# Patient Record
Sex: Female | Born: 1965 | Race: White | Hispanic: No | Marital: Married | Smoking: Never smoker
Health system: Southern US, Community
[De-identification: ages and names within clinical notes are randomized; demographics above are authoritative.]

## PROBLEM LIST (undated history)

## (undated) DIAGNOSIS — K602 Anal fissure, unspecified: Secondary | ICD-10-CM

## (undated) HISTORY — PX: AUGMENTATION MAMMAPLASTY: SUR837

## (undated) HISTORY — DX: Anal fissure, unspecified: K60.2

---

## 1999-07-31 HISTORY — PX: BREAST ENHANCEMENT SURGERY: SHX7

## 2001-03-19 ENCOUNTER — Other Ambulatory Visit: Admission: RE | Admit: 2001-03-19 | Discharge: 2001-03-19 | Payer: Self-pay | Admitting: Obstetrics and Gynecology

## 2002-03-26 ENCOUNTER — Other Ambulatory Visit: Admission: RE | Admit: 2002-03-26 | Discharge: 2002-03-26 | Payer: Self-pay | Admitting: Obstetrics and Gynecology

## 2003-04-16 ENCOUNTER — Other Ambulatory Visit: Admission: RE | Admit: 2003-04-16 | Discharge: 2003-04-16 | Payer: Self-pay | Admitting: Obstetrics and Gynecology

## 2003-07-31 HISTORY — PX: CHOLECYSTECTOMY: SHX55

## 2004-04-19 ENCOUNTER — Other Ambulatory Visit: Admission: RE | Admit: 2004-04-19 | Discharge: 2004-04-19 | Payer: Self-pay | Admitting: Obstetrics and Gynecology

## 2004-04-27 ENCOUNTER — Encounter: Admission: RE | Admit: 2004-04-27 | Discharge: 2004-04-27 | Payer: Self-pay | Admitting: Obstetrics and Gynecology

## 2004-08-31 ENCOUNTER — Ambulatory Visit: Payer: Self-pay | Admitting: Internal Medicine

## 2004-09-28 ENCOUNTER — Ambulatory Visit (HOSPITAL_COMMUNITY): Admission: RE | Admit: 2004-09-28 | Discharge: 2004-09-28 | Payer: Self-pay | Admitting: Family Medicine

## 2004-10-25 ENCOUNTER — Encounter: Admission: RE | Admit: 2004-10-25 | Discharge: 2004-10-25 | Payer: Self-pay | Admitting: Gastroenterology

## 2004-11-02 ENCOUNTER — Ambulatory Visit (HOSPITAL_COMMUNITY): Admission: RE | Admit: 2004-11-02 | Discharge: 2004-11-02 | Payer: Self-pay | Admitting: Gastroenterology

## 2004-12-08 ENCOUNTER — Encounter: Admission: RE | Admit: 2004-12-08 | Discharge: 2004-12-08 | Payer: Self-pay | Admitting: Family Medicine

## 2004-12-20 ENCOUNTER — Ambulatory Visit (HOSPITAL_COMMUNITY): Admission: RE | Admit: 2004-12-20 | Discharge: 2004-12-20 | Payer: Self-pay | Admitting: *Deleted

## 2004-12-20 ENCOUNTER — Encounter (INDEPENDENT_AMBULATORY_CARE_PROVIDER_SITE_OTHER): Payer: Self-pay | Admitting: Specialist

## 2005-03-15 ENCOUNTER — Encounter: Admission: RE | Admit: 2005-03-15 | Discharge: 2005-03-15 | Payer: Self-pay | Admitting: Family Medicine

## 2005-07-30 HISTORY — PX: NASAL SEPTUM SURGERY: SHX37

## 2005-09-20 ENCOUNTER — Other Ambulatory Visit: Admission: RE | Admit: 2005-09-20 | Discharge: 2005-09-20 | Payer: Self-pay | Admitting: Family Medicine

## 2006-10-17 ENCOUNTER — Other Ambulatory Visit: Admission: RE | Admit: 2006-10-17 | Discharge: 2006-10-17 | Payer: Self-pay | Admitting: Family Medicine

## 2007-04-23 ENCOUNTER — Encounter: Admission: RE | Admit: 2007-04-23 | Discharge: 2007-04-23 | Payer: Self-pay | Admitting: Family Medicine

## 2007-10-23 ENCOUNTER — Other Ambulatory Visit: Admission: RE | Admit: 2007-10-23 | Discharge: 2007-10-23 | Payer: Self-pay | Admitting: Family Medicine

## 2008-05-21 ENCOUNTER — Encounter: Admission: RE | Admit: 2008-05-21 | Discharge: 2008-05-21 | Payer: Self-pay | Admitting: Family Medicine

## 2008-11-12 ENCOUNTER — Other Ambulatory Visit: Admission: RE | Admit: 2008-11-12 | Discharge: 2008-11-12 | Payer: Self-pay | Admitting: Family Medicine

## 2009-11-03 ENCOUNTER — Encounter: Admission: RE | Admit: 2009-11-03 | Discharge: 2009-11-03 | Payer: Self-pay | Admitting: Family Medicine

## 2009-11-17 ENCOUNTER — Other Ambulatory Visit: Admission: RE | Admit: 2009-11-17 | Discharge: 2009-11-17 | Payer: Self-pay | Admitting: Family Medicine

## 2010-11-27 ENCOUNTER — Other Ambulatory Visit: Payer: Self-pay | Admitting: Family Medicine

## 2010-11-27 DIAGNOSIS — Z1231 Encounter for screening mammogram for malignant neoplasm of breast: Secondary | ICD-10-CM

## 2010-12-15 NOTE — Op Note (Signed)
NAMECHAD, Laurie Meyer                 ACCOUNT NO.:  [id number]   MEDICAL RECORD NO.:  [id number]          PATIENT TYPE:  AMB   LOCATION:  DAY                          FACILITY:  [facility]   PHYSICIAN:  Vikki Ports, MDDATE OF BIRTH:  1965-10-02   DATE OF PROCEDURE:  12/20/2004  DATE OF DISCHARGE:                                 OPERATIVE REPORT   PREOPERATIVE DIAGNOSIS:  Symptomatic cholelithiasis.   POSTOPERATIVE DIAGNOSIS:  Symptomatic cholelithiasis.   PROCEDURE:  Laparoscopic cholecystectomy.   SURGEON:  Vikki Ports, M.D.   ASSISTANT:  Anselm Pancoast. Zachery Dakins, M.D.   ANESTHESIA:  General.   DESCRIPTION OF PROCEDURE:  The patient was taken to the operating room and  placed in the supine position.  After adequate general anesthesia was  induced using endotracheal tube, the abdomen was prepped and draped in the  normal sterile fashion.  Entering a transverse infraumbilical incision, I  dissected down to the fascia.  The fascia was opened vertically.  An 0  Vicryl pursestring suture was placed around the fascia defect and Hassan  trocar was placed in the abdomen.  The abdomen was insufflated with  continuous flow carbon dioxide and under direct visualization a 10 mm port  was placed in the subxiphoid region and two 5 mm ports were placed in the  right abdomen.  The gallbladder was identified and retracted cephalad.  The  neck of the gallbladder was easily identified.  A long, thin cystic duct was  identified.  A good window was dissected posterior to it.  It was triply  clipped and divided.  The cystic artery was dissected free, triply clipped  and divided in a similar fashion.  The gallbladder had good mesentery and  was easily taken off of the gallbladder bed using Bovie electrocautery and  placed in an EndoCatch bag.  Additional stones were removed from the inside  of the gallbladder to remove the gallbladder completely from the umbilical  port.  The fascial defect  was closed with a Vicryl suture.  The wound was  irrigated.  Pneumoperitoneum was released.  Trocars were removed.  The skin  incisions were closed with subcuticular 4-0 Monocryl and injected with 0.5  Marcaine.  The patient tolerated the procedure well and went to the PACU in  good condition.      KRH/MEDQ  D:  12/20/2004  T:  12/20/2004  Job:  [id number]

## 2010-12-21 ENCOUNTER — Ambulatory Visit
Admission: RE | Admit: 2010-12-21 | Discharge: 2010-12-21 | Disposition: A | Discharge status: Home / Self Care | Payer: BC Managed Care – PPO | Source: Ambulatory Visit | Attending: Family Medicine | Admitting: Family Medicine

## 2010-12-21 DIAGNOSIS — Z1231 Encounter for screening mammogram for malignant neoplasm of breast: Secondary | ICD-10-CM

## 2011-09-20 ENCOUNTER — Other Ambulatory Visit (HOSPITAL_COMMUNITY)
Admission: RE | Admit: 2011-09-20 | Discharge: 2011-09-20 | Disposition: A | Discharge status: Home / Self Care | Payer: BC Managed Care – PPO | Source: Ambulatory Visit | Attending: Family Medicine | Admitting: Family Medicine

## 2011-09-20 ENCOUNTER — Other Ambulatory Visit: Payer: Self-pay | Admitting: Family Medicine

## 2011-09-20 DIAGNOSIS — Z113 Encounter for screening for infections with a predominantly sexual mode of transmission: Secondary | ICD-10-CM | POA: Insufficient documentation

## 2011-09-20 DIAGNOSIS — Z124 Encounter for screening for malignant neoplasm of cervix: Secondary | ICD-10-CM | POA: Insufficient documentation

## 2011-10-12 ENCOUNTER — Ambulatory Visit (INDEPENDENT_AMBULATORY_CARE_PROVIDER_SITE_OTHER): Payer: BC Managed Care – PPO | Admitting: General Surgery

## 2011-10-12 ENCOUNTER — Encounter (INDEPENDENT_AMBULATORY_CARE_PROVIDER_SITE_OTHER): Payer: Self-pay | Admitting: General Surgery

## 2011-10-12 VITALS — BP 108/74 | HR 76 | Temp 97.6°F | Resp 12 | Ht 69.0 in | Wt 144.2 lb

## 2011-10-12 DIAGNOSIS — K602 Anal fissure, unspecified: Secondary | ICD-10-CM

## 2011-10-12 DIAGNOSIS — K625 Hemorrhage of anus and rectum: Secondary | ICD-10-CM | POA: Insufficient documentation

## 2011-10-12 NOTE — Patient Instructions (Signed)
Anal Fissure, Adult An anal fissure is a small tear or crack in the skin around the anus. Bleeding from a fissure usually stops on its own within a few minutes. However, bleeding will often reoccur with each bowel movement until the crack heals.  CAUSES   Passing large, hard stools.   Frequent diarrheal stools.   Constipation.   Inflammatory bowel disease (Crohn's disease or ulcerative colitis).   Infections.   Anal sex.  SYMPTOMS   Small amounts of blood seen on your stools, on toilet paper, or in the toilet after a bowel movement.   Rectal bleeding.   Painful bowel movements.   Itching or irritation around the anus.  DIAGNOSIS Your caregiver will examine the anal area. An anal fissure can usually be seen with careful inspection. A rectal exam may be performed and a short tube (anoscope) may be used to examine the anal canal. TREATMENT   You may be instructed to take fiber supplements. These supplements can soften your stool to help make bowel movements easier.   Sitz baths may be recommended to help heal the tear. Do not use soap in the sitz baths.   A medicated cream or ointment may be prescribed to lessen discomfort.  HOME CARE INSTRUCTIONS   Maintain a diet high in fruits, whole grains, and vegetables. Avoid constipating foods like bananas and dairy products.   Take sitz baths as directed by your caregiver.   Drink enough fluids to keep your urine clear or pale yellow.   Only take over-the-counter or prescription medicines for pain, discomfort, or fever as directed by your caregiver. Do not take aspirin as this may increase bleeding.   Do not use ointments containing numbing medications (anesthetics) or hydrocortisone. They could slow healing.  SEEK MEDICAL CARE IF:   Your fissure is not completely healed within 3 days.   You have further bleeding.   You have a fever.   You have diarrhea mixed with blood.   You have pain.   Your problem is getting worse  rather than better.  MAKE SURE YOU:   Understand these instructions.   Will watch your condition.   Will get help right away if you are not doing well or get worse.  Document Released: 07/16/2005 Document Revised: 07/05/2011 Document Reviewed: 12/31/2010 [facility] Patient Information 2012 [place], [place].  Fiber Content in Foods Drinking plenty of fluids and consuming foods high in fiber can help with constipation. See the list below for the fiber content of some common foods. Starches and Grains / Dietary Fiber (g)  Cheerios, 1 cup / 3 g   Kellogg's Corn Flakes, 1 cup / 0.7 g   Rice Krispies, 1  cup / 0.3 g   Quaker Oat Life Cereal,  cup / 2.1 g   Oatmeal, instant (cooked),  cup / 2 g   Kellogg's Frosted Mini Wheats, 1 cup / 5.1 g   Rice, brown, long-grain (cooked), 1 cup / 3.5 g   Rice, white, long-grain (cooked), 1 cup / 0.6 g   Macaroni, cooked, enriched, 1 cup / 2.5 g  Legumes / Dietary Fiber (g)  Beans, baked, canned, plain or vegetarian,  cup / 5.2 g   Beans, kidney, canned,  cup / 6.8 g   Beans, pinto, dried (cooked),  cup / 7.7 g   Beans, pinto, canned,  cup / 5.5 g  Breads and Crackers / Dietary Fiber (g)  Graham crackers, plain or honey, 2 squares / 0.7 g   Saltine  crackers, 3 squares / 0.3 g   Pretzels, plain, salted, 10 pieces / 1.8 g   Bread, whole-wheat, 1 slice / 1.9 g   Bread, white, 1 slice / 0.7 g   Bread, raisin, 1 slice / 1.2 g   Bagel, plain, 3 oz / 2 g   Tortilla, flour, 1 oz / 0.9 g   Tortilla, corn, 1 small / 1.5 g   Bun, hamburger or hotdog, 1 small / 0.9 g  Fruits / Dietary Fiber (g)  Apple, raw with skin, 1 medium / 4.4 g   Applesauce, sweetened,  cup / 1.5 g   Banana,  medium / 1.5 g   Grapes, 10 grapes / 0.4 g   Orange, 1 small / 2.3 g   Raisin, 1.5 oz / 1.6 g   Melon, 1 cup / 1.4 g  Vegetables / Dietary Fiber (g)  Green beans, canned,  cup / 1.3 g   Carrots (cooked),  cup / 2.3 g   Broccoli  (cooked),  cup / 2.8 g   Peas, frozen (cooked),  cup / 4.4 g   Potatoes, mashed,  cup / 1.6 g   Lettuce, 1 cup / 0.5 g   Corn, canned,  cup / 1.6 g   Tomato,  cup / 1.1 g  Document Released: 12/02/2006 Document Revised: 07/05/2011 Document Reviewed: 01/27/2007 [facility] Patient Information 2012 [place], [place].

## 2011-10-13 NOTE — Progress Notes (Signed)
Patient ID: Laurie Meyer, female   DOB: 1966/07/06, 46 y.o.   MRN: [id number]  Chief Complaint  Patient presents with  . Rectal Pain    new pt- eval rectal pain    HPI Laurie Meyer is a 46 y.o. female.   HPI 46 year old Caucasian female referred by Dr. Matthias Hughs for evaluation of an anal fissure and anal pain. The patient states that she's had a prolonged history of problems with her bowel habits ever since she had a cholecystectomy done. She has been seeing Dr. Matthias Hughs for over a year. He was initially managing her for slow transit constipation. She has undergone biofeedback training. She states that she started to have a new problem in November. It was mainly anal pain with defecation. Initially the pain would last hours. Now the pain just last during defecation. She states the pain is located right at the anal opening and describes it as a dull pain. It does radiate to a degree. She reports averaging about 5 bowel movements a week. She is taking MiraLax on a daily basis as well as Librarian, academic. She reports alternating bouts of loose stool and constipation. The anal pain mainly occurs when she has a bulky stool. She has noticed some blood in the commode on a very rare occasion. She states that she sits on the commode for less than 5 minutes time. She does strain. She drinks approximately 2 bottles of water per day. She reports eating a high-fiber diet. She initially was given a prescription for a diltiazem ointment and was told to do sitz baths as well. She only did the diltiazem ointment for approximately 10 days. She states while using the diltiazem ointment she got improvement. She rarely has abdominal pain. She sometimes feels bloated. She denies any weight loss or fecal/urinary incontinence.  History reviewed. No pertinent past medical history.  Past Surgical History  Procedure Date  . Breast enhancement surgery 2001  . Nasal septum surgery 2007  . Cholecystectomy 2005    Family History  Problem  Relation Age of Onset  . Cancer Maternal Uncle     lung    Social History History  Substance Use Topics  . Smoking status: Never Smoker   . Smokeless tobacco: Not on file  . Alcohol Use: No    No Known Allergies  Current Outpatient Prescriptions  Medication Sig Dispense Refill  . AVIANE 0.1-20 MG-MCG tablet daily.      Marland Kitchen Fexofenadine HCl (ALLEGRA PO) Take by mouth daily.      . Multiple Vitamin (MULTIVITAMIN) capsule Take 1 capsule by mouth daily.      . polyethylene glycol powder (GLYCOLAX/MIRALAX) powder daily.      . Probiotic Product (ALIGN PO) Take by mouth daily.        Review of Systems Review of Systems  Constitutional: Negative for fever, activity change, appetite change and unexpected weight change.  HENT: Negative for hearing loss, congestion and neck pain.   Eyes: Negative for photophobia and visual disturbance.  Respiratory: Negative for chest tightness and shortness of breath.   Cardiovascular: Negative for chest pain.  Gastrointestinal: Negative for abdominal distention.       See hpi  Genitourinary: Negative for dysuria, hematuria and difficulty urinating.  Musculoskeletal: Negative for back pain and gait problem.  Skin: Negative for color change and wound.  Neurological: Negative for tremors, seizures, speech difficulty, weakness and numbness.  Hematological: Negative for adenopathy. Does not bruise/bleed easily.  Psychiatric/Behavioral: Negative for behavioral problems and sleep  disturbance.    Blood pressure 108/74, pulse 76, temperature 97.6 F (36.4 C), temperature source Temporal, resp. rate 12, height 5\' 9"  (1.753 m), weight 144 lb 3.2 oz (65.409 kg).  Physical Exam Physical Exam  Vitals reviewed. Constitutional: She is oriented to person, place, and time. She appears well-developed and well-nourished. No distress.       thin  HENT:  Head: Normocephalic and atraumatic.  Right Ear: External ear normal.  Left Ear: External ear normal.  Nose:  Nose normal.  Eyes: Conjunctivae are normal. No scleral icterus.  Neck: Normal range of motion. Neck supple. No tracheal deviation present.  Cardiovascular: Normal rate, regular rhythm and normal heart sounds.   Pulmonary/Chest: Effort normal and breath sounds normal. No respiratory distress. She has no wheezes.  Abdominal: Soft. Bowel sounds are normal. She exhibits no distension. There is no tenderness.  Genitourinary: Rectal exam shows no external hemorrhoid and no mass.       Small fissure in posterior midline. DRE -increased anal tone. Tender with DRE. No anoscopy attempted secondary to presence of fissure  Musculoskeletal: Normal range of motion. She exhibits no edema and no tenderness.  Lymphadenopathy:    She has no cervical adenopathy.  Neurological: She is alert and oriented to person, place, and time.  Skin: Skin is warm and dry. She is not diaphoretic.  Psychiatric: She has a normal mood and affect. Her behavior is normal. Judgment and thought content normal.    Data Reviewed Dr Buccini's notes from 11/12, 04/12/11, 10/27/10  Assessment    Anal pain Anal fissure    Plan    We discussed the etiology of anal fissures. The patient was given educational material as well as diagrams. We discussed nonoperative and operative management of anal fissures.  We discussed nonoperative management including correcting underlying bowel habits such as constipation, avoiding bathroom reading, avoiding straining with defecation. We also discussed the use of topical ointments such as nifedipine or diltiazem ointment. We also discussed the use of Botox injection.  With respect to surgical intervention, we discussed an anal sphincterotomy. I described how the procedure is performed. We also discussed the aftercare. We discussed the risk and benefits of surgery including but not limited to bleeding, infection, blood clot formation, general anesthesia risk, urinary retention, and the risk of  incontinence. We discussed a 20-25% chance of incontinence to flatus, a 10-20% chance of incontinence to liquid stool, and a 5-10% chance of incontinence to solid stool. I explained that the percentages I quoted are from the literature and not from my personal practice experience.  My recommendation was to continue with non-operative management first since she did get some improvement with Diltiazem ointment.   The patient has elected to continue with non-operative management for now. She was instructed to use the diltiazem ointment twice a day for a minimum of 6 weeks, increase the water in her diet, and avoid straining. With her underlying slow transit constipation, this isn't going to be an easy fix  F/u 6-8 weeks  [name]. Andrey Campanile, MD, FACS General, Bariatric, & Minimally Invasive Surgery [facility] [place] [facility], [place]         [facility] M 10/13/2011, 12:55 PM

## 2011-11-21 ENCOUNTER — Encounter (INDEPENDENT_AMBULATORY_CARE_PROVIDER_SITE_OTHER): Payer: Self-pay | Admitting: General Surgery

## 2011-11-21 ENCOUNTER — Ambulatory Visit (INDEPENDENT_AMBULATORY_CARE_PROVIDER_SITE_OTHER): Payer: BC Managed Care – PPO | Admitting: General Surgery

## 2011-11-21 VITALS — BP 98/60 | HR 80 | Resp 16 | Ht 68.0 in | Wt 142.0 lb

## 2011-11-21 DIAGNOSIS — K602 Anal fissure, unspecified: Secondary | ICD-10-CM

## 2011-11-21 MED ORDER — POLYETHYLENE GLYCOL 3350 17 GM/SCOOP PO POWD: 17.0000 g | Freq: Every day | ORAL | Status: DC

## 2011-11-21 NOTE — Progress Notes (Signed)
Subjective:     Patient ID: Laurie Meyer, female   DOB: Dec 21, 1965, 46 y.o.   MRN: [id number]  HPI 46 year old Caucasian female comes in for followup for her anal fissure and slow transit constipation. I initially saw her on March 15. We diagnosed an anal fissure at that time. She was instructed to do diltiazem twice a day, increase her daily water intake, and increase the fiber in her diet. She states that she has done very well. She did have an episode of gastritis a few weeks ago that resulted in vomiting, diarrhea, and indigestion. During that week she did not use the diltiazem ointment. However her symptoms have resolved. She denies any pain with defecation. She denies any discomfort when having a bowel movement anymore. She denies any blood or to a paper on the commode. She is still taking MiraLax on a daily basis. The frequency of her bowel movements have increased. There are several days where she may get a day or 2 without a bowel movement; however, she feels she is more regular than she used to be. She attributes this to increasing the fiber in her diet. She states that she is eating more spinach and more beans. However she really hasn't increase her water intake.  Review of Systems See above    Objective:   Physical Exam BP 98/60  Pulse 80  Resp 16  Ht 5\' 8"  (1.727 m)  Wt 142 lb (64.411 kg)  BMI 21.59 kg/m2 Alert, nad Skin - no rash, edema, jaundice Psych - alert, appropriate Rectal- no ext hemorrhoid, no prolapsed hemorrhoid. Healed posterior anal fissure    Assessment:     Anal fissure-resolved Slow transit constipation    Plan:     I'm glad her symptoms have dramatically improved. It appears her anal fissure is healed. However I cautioned her that she is at risk for recurrence of her fissure if she should become severely constipated. I instructed her to continue her high fiber diet as well as to increase her daily water intake. Hopefully over time she can come off the  MiraLax. We renewed her MiraLax prescription. Followup p.r.n.  [name]. Andrey Campanile, MD, FACS General, Bariatric, & Minimally Invasive Surgery [facility], [place]

## 2011-11-21 NOTE — Patient Instructions (Signed)
Keep eating a high fiber diet and increase your water intake

## 2012-04-24 ENCOUNTER — Other Ambulatory Visit: Payer: Self-pay | Admitting: Family Medicine

## 2012-04-24 ENCOUNTER — Other Ambulatory Visit (HOSPITAL_COMMUNITY)
Admission: RE | Admit: 2012-04-24 | Discharge: 2012-04-24 | Disposition: A | Discharge status: Home / Self Care | Payer: BC Managed Care – PPO | Source: Ambulatory Visit | Attending: Family Medicine | Admitting: Family Medicine

## 2012-04-24 DIAGNOSIS — Z1151 Encounter for screening for human papillomavirus (HPV): Secondary | ICD-10-CM | POA: Insufficient documentation

## 2012-04-24 DIAGNOSIS — Z124 Encounter for screening for malignant neoplasm of cervix: Secondary | ICD-10-CM | POA: Insufficient documentation

## 2012-05-06 ENCOUNTER — Ambulatory Visit: Payer: BC Managed Care – PPO | Attending: Family Medicine

## 2012-05-06 DIAGNOSIS — M545 Low back pain, unspecified: Secondary | ICD-10-CM | POA: Insufficient documentation

## 2012-05-06 DIAGNOSIS — M25559 Pain in unspecified hip: Secondary | ICD-10-CM | POA: Insufficient documentation

## 2012-05-06 DIAGNOSIS — IMO0001 Reserved for inherently not codable concepts without codable children: Secondary | ICD-10-CM | POA: Insufficient documentation

## 2012-05-13 ENCOUNTER — Ambulatory Visit: Payer: BC Managed Care – PPO

## 2012-05-15 ENCOUNTER — Ambulatory Visit: Payer: BC Managed Care – PPO

## 2012-05-19 ENCOUNTER — Ambulatory Visit: Payer: BC Managed Care – PPO

## 2012-05-22 ENCOUNTER — Ambulatory Visit: Payer: BC Managed Care – PPO

## 2012-05-26 ENCOUNTER — Ambulatory Visit: Payer: BC Managed Care – PPO

## 2012-05-28 ENCOUNTER — Ambulatory Visit: Payer: BC Managed Care – PPO | Admitting: Physical Therapy

## 2012-06-04 ENCOUNTER — Ambulatory Visit: Payer: BC Managed Care – PPO | Admitting: Physical Therapy

## 2012-06-09 ENCOUNTER — Other Ambulatory Visit (INDEPENDENT_AMBULATORY_CARE_PROVIDER_SITE_OTHER): Payer: Self-pay | Admitting: General Surgery

## 2012-09-24 ENCOUNTER — Other Ambulatory Visit (INDEPENDENT_AMBULATORY_CARE_PROVIDER_SITE_OTHER): Payer: Self-pay | Admitting: General Surgery

## 2012-10-01 ENCOUNTER — Other Ambulatory Visit: Payer: Self-pay

## 2012-10-01 DIAGNOSIS — Z1231 Encounter for screening mammogram for malignant neoplasm of breast: Secondary | ICD-10-CM

## 2012-11-13 ENCOUNTER — Ambulatory Visit
Admission: RE | Admit: 2012-11-13 | Discharge: 2012-11-13 | Disposition: A | Discharge status: Home / Self Care | Payer: 59 | Source: Ambulatory Visit

## 2012-11-13 DIAGNOSIS — Z1231 Encounter for screening mammogram for malignant neoplasm of breast: Secondary | ICD-10-CM

## 2012-12-25 ENCOUNTER — Other Ambulatory Visit (INDEPENDENT_AMBULATORY_CARE_PROVIDER_SITE_OTHER): Payer: Self-pay | Admitting: General Surgery

## 2013-02-06 ENCOUNTER — Ambulatory Visit (INDEPENDENT_AMBULATORY_CARE_PROVIDER_SITE_OTHER): Payer: 59 | Admitting: General Surgery

## 2013-02-06 ENCOUNTER — Encounter (INDEPENDENT_AMBULATORY_CARE_PROVIDER_SITE_OTHER): Payer: Self-pay | Admitting: General Surgery

## 2013-02-06 VITALS — BP 100/70 | HR 78 | Temp 97.5°F | Resp 18 | Ht 68.5 in | Wt 148.2 lb

## 2013-02-06 DIAGNOSIS — K602 Anal fissure, unspecified: Secondary | ICD-10-CM

## 2013-02-06 NOTE — Patient Instructions (Signed)
WHAT IS AN ANAL FISSURE? An anal fissure (fissure-in-ano) is a small, oval shaped tear in skin that lines the opening of the anus. Fissures typically cause severe pain and bleeding with bowel movements. Fissures are quite common in the general population, but are often confused with other causes of pain and bleeding, such as hemorrhoids. WHAT ARE THE SYMPTOMS OF AN ANAL FISSURE? The typical symptoms of an anal fissure include severe pain during, and especially after, a bowel movement, lasting from several minutes to a few hours. Patients may also notice bright red blood from the anus that can be seen on the toilet paper or on the stool. Between bowel movements, patients with anal fissures are often relatively symptom-free. Many patients are fearful of having a bowel movement and may try to avoid defecation secondary to the pain.  WHAT CAUSES AN ANAL FISSURE? Fissures are usually caused by trauma to the inner lining of the anus. Patients with tight anal sphincter muscles (i.e., increased muscle tone) are more prone to developing anal fissures. A hard, dry bowel movement is typically responsible, but loose stools and diarrhea can also be the cause. Following a bowel movement, severe anal pain can produce spasm of the anal sphincter muscle, resulting in a decrease in blood flow to the site of the injury, thus impairing healing of the wound. The next bowel movement results in more pain, anal spasm, decreased blood flow to the area, and the cycle continues. Treatments are aimed at interrupting this cycle by relaxing the anal sphincter muscle to promote healing of the fissure.  Other, less common, causes include inflammatory conditions and certain anal infections or tumors. Anal fissures may be acute (recent onset) or chronic (present for a long period of time). Chronic fissures may be more difficult to treat, and may also have an external lump associated with the tear, called a sentinel pile or skin tag, as well as  extra tissue just inside the anal canal (hypertrophied papilla) . WHAT IS THE TREATMENT OF ANAL FISSURES? The majority of anal fissures do not require surgery. The most common treatment for an acute anal fissure consists of making the stool more formed and bulky with a diet high in fiber and utilization of over-the-counter fiber supplementation (totaling 25-35 grams of fiber/day). Stool softeners and increasing water intake may be necessary to promote soft bowel movements and aid in the healing process. Topical anesthetics for pain and warm tub baths (sitz baths) for 10-20 minutes several times a day (especially after bowel movements) are soothing and promote relaxation of the anal muscles, which may help the healing process.  Other medications (such as nitroglycerin, nifedipine, or diltiazem) may be prescribed that allow relaxation of the anal sphincter muscles. Your surgeon will go over benefits and side-effects of each of these with you. Narcotic pain medications are not recommended for anal fissures, as they promote constipation. Chronic fissures are generally more difficult to treat, and your surgeon may advise surgical treatment. WILL THE PROBLEM RETURN? Fissures can recur easily, and it is quite common for a fully healed fissure to recur after a hard bowel movement or other trauma. Even when the pain and bleeding have subsided, it is very important to continue good bowel habits and a diet high in fiber as a lifestyle change. If the problem returns without an obvious cause, further assessment is warranted. WHAT CAN BE DONE IF THE FISSURE DOES NOT HEAL? A fissure that fails to respond to conservative measures should be re-examined. Persistent hard or loose   bowel movements, scarring, or spasm of the internal anal muscle all contribute to delayed healing. Other medical problems such as inflammatory bowel disease (Crohn's disease), infections, or anal tumors can cause symptoms similar to anal fissures.  Patients suffering from persistent anal pain should be examined to exclude these symptoms. This may include a colonoscopy or an exam in the operating room under anesthesia. WHAT DOES SURGERY INVOLVE? Surgical options for treating anal fissure include Botulinum toxin (Botox) injection into the anal sphincter and surgical division of a portion of the internal anal sphincter (lateral internal sphincterotomy). Both of these are performed typically as outpatient, same-day procedures, or occasionally in the office setting. The goal of these surgical options is to promote relaxation of the anal sphincter, thereby decreasing anal pain and spasm, allowing the fissure to heal. Botox injection results in healing in 50-80% of patients, while sphincterotomy is reported to be over 90% successful. If a sentinel pile is present, it may be removed to promote healing of the fissure. All surgical procedures carry some risk, and a sphincterotomy can rarely interfere with one's ability to control gas and stool. Your colon and rectal surgeon will discuss these risks with you to determine the appropriate treatment for your particular situation. HOW LONG IS THE RECOVERY AFTER SURGERY? It is important to note that complete healing with both medical and surgical treatments can take up to approximately 6-10 weeks. However, acute pain after surgery often disappears after a few days. Most patients will be able to return to work and resume daily activities in a few short days after the surgery. CAN FISSURES LEAD TO COLON CANCER? Absolutely not. Persistent symptoms, however, need careful evaluation since other conditions other than an anal fissure can cause similar symptoms. Your colon and rectal surgeon may request additional tests, even if your fissure has successfully healed. A colonoscopy may be required to exclude other causes of rectal bleeding. WHAT IS A COLON AND RECTAL SURGEON? Colon and rectal surgeons are experts in the  surgical and non-surgical treatment of diseases of the colon, rectum and anus. They have completed advanced surgical training in the treatment of these diseases as well as full general surgical training. Board-certified colon and rectal surgeons complete residencies in general surgery and colon and rectal surgery, and pass intensive examinations conducted by the American Board of Surgery and the American Board of Colon and Rectal Surgery. They are well-versed in the treatment of both benign and malignant diseases of the colon, rectum and anus and are able to perform routine screening examinations and surgically treat conditions if indicated to do so.  Author: Michael A. Valente, DO, on behalf of the [employer] Public Relations Committee   2012 American Society of Colon & Rectal Surgeons   Fiber Chart  You should 25-30g of fiber per day and drinking 8 glasses of water to help your bowels move regularly.  In the chart below you can look up how much fiber you are getting in an average day.  If you are not getting enough fiber, you should add a fiber supplement to your diet.  Examples of this include Metamucil, FiberCon and Citrucel.  These can be purchased at your local grocery store or pharmacy.      http://[email or web address].edu/offices/health/nutritioncoach/AtoZ/[email or web address]  

## 2013-02-06 NOTE — Progress Notes (Signed)
Chief Complaint  Patient presents with  . New Evaluation    eval anal fissure    HISTORY: Laurie Meyer is a 47 y.o. female who presents to the office with rectal pain.  This had been occurring for a couple years.  She has tried miralax in the past with some success.  Larger BM's makes the symptoms worse.   It is continuous in nature.  Her pain is mostly with BM's and is minimal unless her BM's are larger.  Her bowel habits are regular and her bowel movements are soft.  Her fiber intake is dietary.  She has never had a colonoscopy.       History reviewed. No pertinent past medical history.    Past Surgical History  Procedure Laterality Date  . Breast enhancement surgery  2001  . Nasal septum surgery  2007  . Cholecystectomy  2005        Current Outpatient Prescriptions  Medication Sig Dispense Refill  . AVIANE 0.1-20 MG-MCG tablet daily.      . cetirizine (ZYRTEC) 10 MG tablet Take 10 mg by mouth daily.      . fluconazole (DIFLUCAN) 150 MG tablet       . Linaclotide (LINZESS) 145 MCG CAPS Take 145 mcg by mouth daily.      . Multiple Vitamin (MULTIVITAMIN) capsule Take 1 capsule by mouth daily.      . polyethylene glycol powder (GLYCOLAX/MIRALAX) powder TAKE 1 CAPFUL DAILY IN 4 TO 8 OUNCES OF LIQUID  255 g  2  . Probiotic Product (PROBIOTIC DAILY PO) Take by mouth daily. Medication Name-Insync       No current facility-administered medications for this visit.      No Known Allergies    Family History  Problem Relation Age of Onset  . Cancer Maternal Uncle     lung    History   Social History  . Marital Status: Married    Spouse Name: N/A    Number of Children: N/A  . Years of Education: N/A   Social History Main Topics  . Smoking status: Never Smoker   . Smokeless tobacco: Never Used  . Alcohol Use: Yes     Comment: Special Occasions.  . Drug Use: No  . Sexually Active: None   Other Topics Concern  . None   Social History Narrative  . None      REVIEW OF  SYSTEMS - PERTINENT POSITIVES ONLY: Review of Systems - General ROS: negative for - chills, fever or weight loss Hematological and Lymphatic ROS: negative for - bleeding problems, blood clots or bruising Respiratory ROS: no cough, shortness of breath, or wheezing Cardiovascular ROS: no chest pain or dyspnea on exertion Gastrointestinal ROS: occasional right sided abdominal pain, no change in bowel habits, or black or bloody stools Genito-Urinary ROS: no dysuria, trouble voiding, or hematuria  EXAM: Filed Vitals:   02/06/13 1602  BP: 100/70  Pulse: 78  Temp: 97.5 F (36.4 C)  Resp: 18    General appearance: alert and cooperative Resp: clear to auscultation bilaterally Cardio: regular rate and rhythm GI: soft, non-tender; bowel sounds normal; no masses,  no organomegaly Anal exam: shallow fissures posteriorly, anteriorly and one laterally as well, tolerated DRE, no masses palpated.  No sphincter hypertension     ASSESSMENT AND PLAN: Laurie Meyer is a 47 y.o. F that has been treated for ~ 2 years for anal pain.  This has mainly been controlled with Miralax.  On exam she  has 3 shallow fissures.  This makes me slightly concerned for Crohn's disease, but she has no other symptoms of this or FH.  More likely though, her skin is just friable from lack of stretching with the Miralax.  I have recommended a bulking agent, and proper anal hygiene after BM's.  She does not have any sphincter hypertension, so I feel that nitroglycerin or diltiazem would not help her much. She feels that the Lizness is helping her symptoms and that it will get better once she is on this medication longer.  If she continues to have trouble after several months on bulking agents, she may need a colonoscopy to r/o Crohn's.  She will call the office if anything changes.      Laurie Panda, MD Colon and Rectal Surgery / General Surgery [facility]      Visit Diagnoses: 1. Anal fissure      Primary Care Physician: Laurie Dimitri, MD

## 2013-04-06 ENCOUNTER — Other Ambulatory Visit: Payer: Self-pay | Admitting: Family Medicine

## 2013-04-06 DIAGNOSIS — R1031 Right lower quadrant pain: Secondary | ICD-10-CM

## 2013-04-07 ENCOUNTER — Ambulatory Visit
Admission: RE | Admit: 2013-04-07 | Discharge: 2013-04-07 | Disposition: A | Discharge status: Home / Self Care | Payer: 59 | Source: Ambulatory Visit | Attending: Family Medicine | Admitting: Family Medicine

## 2013-04-07 DIAGNOSIS — R1031 Right lower quadrant pain: Secondary | ICD-10-CM

## 2013-06-18 ENCOUNTER — Ambulatory Visit (INDEPENDENT_AMBULATORY_CARE_PROVIDER_SITE_OTHER): Payer: 59 | Admitting: General Surgery

## 2013-06-18 ENCOUNTER — Encounter (INDEPENDENT_AMBULATORY_CARE_PROVIDER_SITE_OTHER): Payer: Self-pay

## 2013-06-18 ENCOUNTER — Encounter (INDEPENDENT_AMBULATORY_CARE_PROVIDER_SITE_OTHER): Payer: Self-pay | Admitting: General Surgery

## 2013-06-18 VITALS — BP 108/72 | HR 72 | Temp 97.7°F | Resp 16 | Ht 68.0 in | Wt 150.0 lb

## 2013-06-18 DIAGNOSIS — K625 Hemorrhage of anus and rectum: Secondary | ICD-10-CM

## 2013-06-18 MED ORDER — HYDROCORTISONE 2.5 % RE CREA: 1.0000 "application " | TOPICAL_CREAM | Freq: Two times a day (BID) | RECTAL | Status: DC

## 2013-06-18 NOTE — Patient Instructions (Signed)
Continue your current bowel regimen.  Try using the hemorrhoid cream after episodes of bleeding.  Notify Dr Buccini's office if you are still having bleeding with bowel movements in another months.

## 2013-06-18 NOTE — Progress Notes (Signed)
Laurie Meyer is a 47 y.o. female who is here for a follow up visit regarding her constipation.  This is better on the Linzess.  She does report occasional bleeding per rectum.  She felt that this was less on the diltiazem gel, but she did have a lot of burning while taking this.  Her bleeding is associated with large BM's.   Objective: Filed Vitals:   06/18/13 1545  BP: 108/72  Pulse: 72  Temp: 97.7 F (36.5 C)  Resp: 16    General appearance: alert and cooperative GI: normal findings: soft, non-tender Perianal: no fissures. Mild external hemorrhoidal columns noted. Grade 1 internal hemorrhoids noted, noninflamed  Assessment and Plan: Laurie Meyer is a 47 year old female who presents to the office with ongoing rectal bleeding. Her constipation issues have done much better with the Linzess.  On exam I see no evidence of continued fissure disease. She does have some internal hemorrhoids noted. These may be the cause of her rectal bleeding. I have given her hemorrhoid cream to help with these symptoms.  She will have been on the Linzess for 6 months after December. I recommended that if she continues to have rectal bleeding despite the use of hemorrhoid cream and being on this medication for that long, I think it would be reasonable to proceed with colonoscopy to make sure there is no other source for her bleeding. I agree with Dr. Matthias Hughs that she does not appear to have Crohn's disease.    Laurie Panda, MD [facility], [place] [phone]

## 2013-06-23 ENCOUNTER — Encounter (INDEPENDENT_AMBULATORY_CARE_PROVIDER_SITE_OTHER): Payer: 59 | Admitting: General Surgery

## 2014-02-10 ENCOUNTER — Other Ambulatory Visit: Payer: Self-pay

## 2014-02-10 DIAGNOSIS — Z1231 Encounter for screening mammogram for malignant neoplasm of breast: Secondary | ICD-10-CM

## 2014-02-25 ENCOUNTER — Encounter (INDEPENDENT_AMBULATORY_CARE_PROVIDER_SITE_OTHER): Payer: Self-pay

## 2014-02-25 ENCOUNTER — Ambulatory Visit
Admission: RE | Admit: 2014-02-25 | Discharge: 2014-02-25 | Disposition: A | Discharge status: Home / Self Care | Payer: 59 | Source: Ambulatory Visit

## 2014-02-25 DIAGNOSIS — Z1231 Encounter for screening mammogram for malignant neoplasm of breast: Secondary | ICD-10-CM

## 2015-04-05 ENCOUNTER — Other Ambulatory Visit: Payer: Self-pay

## 2015-04-05 DIAGNOSIS — Z1231 Encounter for screening mammogram for malignant neoplasm of breast: Secondary | ICD-10-CM

## 2015-04-25 ENCOUNTER — Ambulatory Visit
Admission: RE | Admit: 2015-04-25 | Discharge: 2015-04-25 | Disposition: A | Discharge status: Home / Self Care | Payer: 59 | Source: Ambulatory Visit

## 2015-04-25 DIAGNOSIS — Z1231 Encounter for screening mammogram for malignant neoplasm of breast: Secondary | ICD-10-CM

## 2016-05-14 ENCOUNTER — Other Ambulatory Visit: Payer: Self-pay | Admitting: Family Medicine

## 2016-05-14 DIAGNOSIS — Z1231 Encounter for screening mammogram for malignant neoplasm of breast: Secondary | ICD-10-CM

## 2016-05-14 DIAGNOSIS — Z9882 Breast implant status: Secondary | ICD-10-CM

## 2016-06-07 ENCOUNTER — Other Ambulatory Visit: Payer: Self-pay | Admitting: Family Medicine

## 2016-06-11 ENCOUNTER — Ambulatory Visit
Admission: RE | Admit: 2016-06-11 | Discharge: 2016-06-11 | Disposition: A | Discharge status: Home / Self Care | Payer: 59 | Source: Ambulatory Visit | Attending: Family Medicine | Admitting: Family Medicine

## 2016-06-11 DIAGNOSIS — Z9882 Breast implant status: Secondary | ICD-10-CM

## 2016-06-11 DIAGNOSIS — Z1231 Encounter for screening mammogram for malignant neoplasm of breast: Secondary | ICD-10-CM

## 2016-08-23 DIAGNOSIS — N951 Menopausal and female climacteric states: Secondary | ICD-10-CM | POA: Diagnosis not present

## 2016-08-30 ENCOUNTER — Other Ambulatory Visit: Payer: Self-pay | Admitting: Family Medicine

## 2016-08-30 ENCOUNTER — Other Ambulatory Visit (HOSPITAL_COMMUNITY)
Admission: RE | Admit: 2016-08-30 | Discharge: 2016-08-30 | Disposition: A | Discharge status: Home / Self Care | Payer: 59 | Source: Ambulatory Visit | Attending: Family Medicine | Admitting: Family Medicine

## 2016-08-30 DIAGNOSIS — Z124 Encounter for screening for malignant neoplasm of cervix: Secondary | ICD-10-CM | POA: Insufficient documentation

## 2016-08-30 DIAGNOSIS — N951 Menopausal and female climacteric states: Secondary | ICD-10-CM | POA: Diagnosis not present

## 2016-08-30 DIAGNOSIS — Z5181 Encounter for therapeutic drug level monitoring: Secondary | ICD-10-CM | POA: Diagnosis not present

## 2016-08-30 DIAGNOSIS — K5901 Slow transit constipation: Secondary | ICD-10-CM | POA: Diagnosis not present

## 2016-08-30 DIAGNOSIS — Z0001 Encounter for general adult medical examination with abnormal findings: Secondary | ICD-10-CM | POA: Diagnosis not present

## 2016-09-03 LAB — CYTOLOGY - PAP: Diagnosis: NEGATIVE

## 2016-10-05 DIAGNOSIS — R32 Unspecified urinary incontinence: Secondary | ICD-10-CM | POA: Insufficient documentation

## 2016-10-05 DIAGNOSIS — N329 Bladder disorder, unspecified: Secondary | ICD-10-CM | POA: Insufficient documentation

## 2016-10-05 DIAGNOSIS — K581 Irritable bowel syndrome with constipation: Secondary | ICD-10-CM | POA: Insufficient documentation

## 2016-10-05 DIAGNOSIS — N952 Postmenopausal atrophic vaginitis: Secondary | ICD-10-CM | POA: Diagnosis not present

## 2016-11-16 DIAGNOSIS — J3081 Allergic rhinitis due to animal (cat) (dog) hair and dander: Secondary | ICD-10-CM | POA: Diagnosis not present

## 2016-11-16 DIAGNOSIS — R0602 Shortness of breath: Secondary | ICD-10-CM | POA: Diagnosis not present

## 2016-11-16 DIAGNOSIS — J3089 Other allergic rhinitis: Secondary | ICD-10-CM | POA: Diagnosis not present

## 2016-11-16 DIAGNOSIS — J301 Allergic rhinitis due to pollen: Secondary | ICD-10-CM | POA: Diagnosis not present

## 2016-11-22 DIAGNOSIS — J301 Allergic rhinitis due to pollen: Secondary | ICD-10-CM | POA: Diagnosis not present

## 2016-11-22 DIAGNOSIS — J3081 Allergic rhinitis due to animal (cat) (dog) hair and dander: Secondary | ICD-10-CM | POA: Diagnosis not present

## 2016-11-23 DIAGNOSIS — J3089 Other allergic rhinitis: Secondary | ICD-10-CM | POA: Diagnosis not present

## 2016-11-27 DIAGNOSIS — J069 Acute upper respiratory infection, unspecified: Secondary | ICD-10-CM | POA: Diagnosis not present

## 2016-11-27 DIAGNOSIS — J028 Acute pharyngitis due to other specified organisms: Secondary | ICD-10-CM | POA: Diagnosis not present

## 2016-11-29 DIAGNOSIS — J3081 Allergic rhinitis due to animal (cat) (dog) hair and dander: Secondary | ICD-10-CM | POA: Diagnosis not present

## 2016-11-29 DIAGNOSIS — J3089 Other allergic rhinitis: Secondary | ICD-10-CM | POA: Diagnosis not present

## 2016-11-29 DIAGNOSIS — J301 Allergic rhinitis due to pollen: Secondary | ICD-10-CM | POA: Diagnosis not present

## 2016-12-04 DIAGNOSIS — J3081 Allergic rhinitis due to animal (cat) (dog) hair and dander: Secondary | ICD-10-CM | POA: Diagnosis not present

## 2016-12-04 DIAGNOSIS — J301 Allergic rhinitis due to pollen: Secondary | ICD-10-CM | POA: Diagnosis not present

## 2016-12-04 DIAGNOSIS — J3089 Other allergic rhinitis: Secondary | ICD-10-CM | POA: Diagnosis not present

## 2016-12-06 DIAGNOSIS — J301 Allergic rhinitis due to pollen: Secondary | ICD-10-CM | POA: Diagnosis not present

## 2016-12-06 DIAGNOSIS — J3081 Allergic rhinitis due to animal (cat) (dog) hair and dander: Secondary | ICD-10-CM | POA: Diagnosis not present

## 2016-12-06 DIAGNOSIS — J3089 Other allergic rhinitis: Secondary | ICD-10-CM | POA: Diagnosis not present

## 2016-12-10 DIAGNOSIS — J3089 Other allergic rhinitis: Secondary | ICD-10-CM | POA: Diagnosis not present

## 2016-12-10 DIAGNOSIS — J301 Allergic rhinitis due to pollen: Secondary | ICD-10-CM | POA: Diagnosis not present

## 2016-12-10 DIAGNOSIS — J3081 Allergic rhinitis due to animal (cat) (dog) hair and dander: Secondary | ICD-10-CM | POA: Diagnosis not present

## 2016-12-12 DIAGNOSIS — J3081 Allergic rhinitis due to animal (cat) (dog) hair and dander: Secondary | ICD-10-CM | POA: Diagnosis not present

## 2016-12-12 DIAGNOSIS — J301 Allergic rhinitis due to pollen: Secondary | ICD-10-CM | POA: Diagnosis not present

## 2016-12-12 DIAGNOSIS — J3089 Other allergic rhinitis: Secondary | ICD-10-CM | POA: Diagnosis not present

## 2016-12-17 DIAGNOSIS — J3089 Other allergic rhinitis: Secondary | ICD-10-CM | POA: Diagnosis not present

## 2016-12-17 DIAGNOSIS — J3081 Allergic rhinitis due to animal (cat) (dog) hair and dander: Secondary | ICD-10-CM | POA: Diagnosis not present

## 2016-12-17 DIAGNOSIS — J301 Allergic rhinitis due to pollen: Secondary | ICD-10-CM | POA: Diagnosis not present

## 2016-12-19 DIAGNOSIS — J3081 Allergic rhinitis due to animal (cat) (dog) hair and dander: Secondary | ICD-10-CM | POA: Diagnosis not present

## 2016-12-19 DIAGNOSIS — J3089 Other allergic rhinitis: Secondary | ICD-10-CM | POA: Diagnosis not present

## 2016-12-19 DIAGNOSIS — J301 Allergic rhinitis due to pollen: Secondary | ICD-10-CM | POA: Diagnosis not present

## 2016-12-21 DIAGNOSIS — J3089 Other allergic rhinitis: Secondary | ICD-10-CM | POA: Diagnosis not present

## 2016-12-21 DIAGNOSIS — J3081 Allergic rhinitis due to animal (cat) (dog) hair and dander: Secondary | ICD-10-CM | POA: Diagnosis not present

## 2016-12-21 DIAGNOSIS — J301 Allergic rhinitis due to pollen: Secondary | ICD-10-CM | POA: Diagnosis not present

## 2016-12-25 DIAGNOSIS — J301 Allergic rhinitis due to pollen: Secondary | ICD-10-CM | POA: Diagnosis not present

## 2016-12-25 DIAGNOSIS — J3081 Allergic rhinitis due to animal (cat) (dog) hair and dander: Secondary | ICD-10-CM | POA: Diagnosis not present

## 2016-12-25 DIAGNOSIS — J3089 Other allergic rhinitis: Secondary | ICD-10-CM | POA: Diagnosis not present

## 2016-12-27 DIAGNOSIS — J3081 Allergic rhinitis due to animal (cat) (dog) hair and dander: Secondary | ICD-10-CM | POA: Diagnosis not present

## 2016-12-27 DIAGNOSIS — J3089 Other allergic rhinitis: Secondary | ICD-10-CM | POA: Diagnosis not present

## 2016-12-27 DIAGNOSIS — J301 Allergic rhinitis due to pollen: Secondary | ICD-10-CM | POA: Diagnosis not present

## 2017-01-01 DIAGNOSIS — J3081 Allergic rhinitis due to animal (cat) (dog) hair and dander: Secondary | ICD-10-CM | POA: Diagnosis not present

## 2017-01-01 DIAGNOSIS — J301 Allergic rhinitis due to pollen: Secondary | ICD-10-CM | POA: Diagnosis not present

## 2017-01-01 DIAGNOSIS — J3089 Other allergic rhinitis: Secondary | ICD-10-CM | POA: Diagnosis not present

## 2017-01-03 DIAGNOSIS — J301 Allergic rhinitis due to pollen: Secondary | ICD-10-CM | POA: Diagnosis not present

## 2017-01-03 DIAGNOSIS — J3089 Other allergic rhinitis: Secondary | ICD-10-CM | POA: Diagnosis not present

## 2017-01-03 DIAGNOSIS — J3081 Allergic rhinitis due to animal (cat) (dog) hair and dander: Secondary | ICD-10-CM | POA: Diagnosis not present

## 2017-01-08 DIAGNOSIS — J301 Allergic rhinitis due to pollen: Secondary | ICD-10-CM | POA: Diagnosis not present

## 2017-01-08 DIAGNOSIS — J3089 Other allergic rhinitis: Secondary | ICD-10-CM | POA: Diagnosis not present

## 2017-01-08 DIAGNOSIS — J3081 Allergic rhinitis due to animal (cat) (dog) hair and dander: Secondary | ICD-10-CM | POA: Diagnosis not present

## 2017-01-10 DIAGNOSIS — J3089 Other allergic rhinitis: Secondary | ICD-10-CM | POA: Diagnosis not present

## 2017-01-10 DIAGNOSIS — J301 Allergic rhinitis due to pollen: Secondary | ICD-10-CM | POA: Diagnosis not present

## 2017-01-10 DIAGNOSIS — J3081 Allergic rhinitis due to animal (cat) (dog) hair and dander: Secondary | ICD-10-CM | POA: Diagnosis not present

## 2017-01-15 DIAGNOSIS — J301 Allergic rhinitis due to pollen: Secondary | ICD-10-CM | POA: Diagnosis not present

## 2017-01-15 DIAGNOSIS — J3089 Other allergic rhinitis: Secondary | ICD-10-CM | POA: Diagnosis not present

## 2017-01-15 DIAGNOSIS — J3081 Allergic rhinitis due to animal (cat) (dog) hair and dander: Secondary | ICD-10-CM | POA: Diagnosis not present

## 2017-01-17 DIAGNOSIS — J301 Allergic rhinitis due to pollen: Secondary | ICD-10-CM | POA: Diagnosis not present

## 2017-01-17 DIAGNOSIS — J3089 Other allergic rhinitis: Secondary | ICD-10-CM | POA: Diagnosis not present

## 2017-01-17 DIAGNOSIS — J3081 Allergic rhinitis due to animal (cat) (dog) hair and dander: Secondary | ICD-10-CM | POA: Diagnosis not present

## 2017-01-22 DIAGNOSIS — J3089 Other allergic rhinitis: Secondary | ICD-10-CM | POA: Diagnosis not present

## 2017-01-22 DIAGNOSIS — J3081 Allergic rhinitis due to animal (cat) (dog) hair and dander: Secondary | ICD-10-CM | POA: Diagnosis not present

## 2017-01-22 DIAGNOSIS — J301 Allergic rhinitis due to pollen: Secondary | ICD-10-CM | POA: Diagnosis not present

## 2017-01-25 DIAGNOSIS — J301 Allergic rhinitis due to pollen: Secondary | ICD-10-CM | POA: Diagnosis not present

## 2017-01-25 DIAGNOSIS — J3081 Allergic rhinitis due to animal (cat) (dog) hair and dander: Secondary | ICD-10-CM | POA: Diagnosis not present

## 2017-01-25 DIAGNOSIS — J3089 Other allergic rhinitis: Secondary | ICD-10-CM | POA: Diagnosis not present

## 2017-01-29 DIAGNOSIS — J3089 Other allergic rhinitis: Secondary | ICD-10-CM | POA: Diagnosis not present

## 2017-01-29 DIAGNOSIS — J301 Allergic rhinitis due to pollen: Secondary | ICD-10-CM | POA: Diagnosis not present

## 2017-01-29 DIAGNOSIS — J3081 Allergic rhinitis due to animal (cat) (dog) hair and dander: Secondary | ICD-10-CM | POA: Diagnosis not present

## 2017-01-31 DIAGNOSIS — J301 Allergic rhinitis due to pollen: Secondary | ICD-10-CM | POA: Diagnosis not present

## 2017-01-31 DIAGNOSIS — J3089 Other allergic rhinitis: Secondary | ICD-10-CM | POA: Diagnosis not present

## 2017-01-31 DIAGNOSIS — J3081 Allergic rhinitis due to animal (cat) (dog) hair and dander: Secondary | ICD-10-CM | POA: Diagnosis not present

## 2017-02-04 DIAGNOSIS — J301 Allergic rhinitis due to pollen: Secondary | ICD-10-CM | POA: Diagnosis not present

## 2017-02-04 DIAGNOSIS — J3081 Allergic rhinitis due to animal (cat) (dog) hair and dander: Secondary | ICD-10-CM | POA: Diagnosis not present

## 2017-02-04 DIAGNOSIS — J3089 Other allergic rhinitis: Secondary | ICD-10-CM | POA: Diagnosis not present

## 2017-02-06 DIAGNOSIS — J3081 Allergic rhinitis due to animal (cat) (dog) hair and dander: Secondary | ICD-10-CM | POA: Diagnosis not present

## 2017-02-06 DIAGNOSIS — J301 Allergic rhinitis due to pollen: Secondary | ICD-10-CM | POA: Diagnosis not present

## 2017-02-06 DIAGNOSIS — J3089 Other allergic rhinitis: Secondary | ICD-10-CM | POA: Diagnosis not present

## 2017-02-07 DIAGNOSIS — L821 Other seborrheic keratosis: Secondary | ICD-10-CM | POA: Diagnosis not present

## 2017-02-13 DIAGNOSIS — J3089 Other allergic rhinitis: Secondary | ICD-10-CM | POA: Diagnosis not present

## 2017-02-13 DIAGNOSIS — J301 Allergic rhinitis due to pollen: Secondary | ICD-10-CM | POA: Diagnosis not present

## 2017-02-13 DIAGNOSIS — J3081 Allergic rhinitis due to animal (cat) (dog) hair and dander: Secondary | ICD-10-CM | POA: Diagnosis not present

## 2017-02-20 DIAGNOSIS — J3081 Allergic rhinitis due to animal (cat) (dog) hair and dander: Secondary | ICD-10-CM | POA: Diagnosis not present

## 2017-02-20 DIAGNOSIS — J301 Allergic rhinitis due to pollen: Secondary | ICD-10-CM | POA: Diagnosis not present

## 2017-02-20 DIAGNOSIS — J3089 Other allergic rhinitis: Secondary | ICD-10-CM | POA: Diagnosis not present

## 2017-02-27 DIAGNOSIS — J3081 Allergic rhinitis due to animal (cat) (dog) hair and dander: Secondary | ICD-10-CM | POA: Diagnosis not present

## 2017-02-27 DIAGNOSIS — J301 Allergic rhinitis due to pollen: Secondary | ICD-10-CM | POA: Diagnosis not present

## 2017-02-27 DIAGNOSIS — J3089 Other allergic rhinitis: Secondary | ICD-10-CM | POA: Diagnosis not present

## 2017-03-01 DIAGNOSIS — J3081 Allergic rhinitis due to animal (cat) (dog) hair and dander: Secondary | ICD-10-CM | POA: Diagnosis not present

## 2017-03-01 DIAGNOSIS — J301 Allergic rhinitis due to pollen: Secondary | ICD-10-CM | POA: Diagnosis not present

## 2017-03-04 DIAGNOSIS — J3089 Other allergic rhinitis: Secondary | ICD-10-CM | POA: Diagnosis not present

## 2017-03-06 DIAGNOSIS — J301 Allergic rhinitis due to pollen: Secondary | ICD-10-CM | POA: Diagnosis not present

## 2017-03-06 DIAGNOSIS — J3081 Allergic rhinitis due to animal (cat) (dog) hair and dander: Secondary | ICD-10-CM | POA: Diagnosis not present

## 2017-03-06 DIAGNOSIS — R197 Diarrhea, unspecified: Secondary | ICD-10-CM | POA: Diagnosis not present

## 2017-03-06 DIAGNOSIS — J3089 Other allergic rhinitis: Secondary | ICD-10-CM | POA: Diagnosis not present

## 2017-03-06 DIAGNOSIS — R1033 Periumbilical pain: Secondary | ICD-10-CM | POA: Diagnosis not present

## 2017-03-13 DIAGNOSIS — J3089 Other allergic rhinitis: Secondary | ICD-10-CM | POA: Diagnosis not present

## 2017-03-13 DIAGNOSIS — J301 Allergic rhinitis due to pollen: Secondary | ICD-10-CM | POA: Diagnosis not present

## 2017-03-13 DIAGNOSIS — J3081 Allergic rhinitis due to animal (cat) (dog) hair and dander: Secondary | ICD-10-CM | POA: Diagnosis not present

## 2017-03-19 DIAGNOSIS — J3089 Other allergic rhinitis: Secondary | ICD-10-CM | POA: Diagnosis not present

## 2017-03-19 DIAGNOSIS — J3081 Allergic rhinitis due to animal (cat) (dog) hair and dander: Secondary | ICD-10-CM | POA: Diagnosis not present

## 2017-03-19 DIAGNOSIS — J301 Allergic rhinitis due to pollen: Secondary | ICD-10-CM | POA: Diagnosis not present

## 2017-03-20 DIAGNOSIS — R1011 Right upper quadrant pain: Secondary | ICD-10-CM | POA: Diagnosis not present

## 2017-03-20 DIAGNOSIS — Q899 Congenital malformation, unspecified: Secondary | ICD-10-CM | POA: Diagnosis not present

## 2017-03-22 ENCOUNTER — Other Ambulatory Visit: Payer: Self-pay | Admitting: Family Medicine

## 2017-03-22 DIAGNOSIS — R1011 Right upper quadrant pain: Secondary | ICD-10-CM

## 2017-03-22 DIAGNOSIS — Q899 Congenital malformation, unspecified: Secondary | ICD-10-CM

## 2017-03-26 ENCOUNTER — Ambulatory Visit
Admission: RE | Admit: 2017-03-26 | Discharge: 2017-03-26 | Disposition: A | Discharge status: Home / Self Care | Payer: 59 | Source: Ambulatory Visit | Attending: Family Medicine | Admitting: Family Medicine

## 2017-03-26 DIAGNOSIS — R1011 Right upper quadrant pain: Secondary | ICD-10-CM | POA: Diagnosis not present

## 2017-03-26 DIAGNOSIS — Q899 Congenital malformation, unspecified: Secondary | ICD-10-CM

## 2017-03-26 MED ORDER — IOPAMIDOL (ISOVUE-300) INJECTION 61%
100.0000 mL | Freq: Once | INTRAVENOUS | Status: AC | PRN
  Administered 2017-03-26: 100 mL via INTRAVENOUS

## 2017-03-27 DIAGNOSIS — J3081 Allergic rhinitis due to animal (cat) (dog) hair and dander: Secondary | ICD-10-CM | POA: Diagnosis not present

## 2017-03-27 DIAGNOSIS — J3089 Other allergic rhinitis: Secondary | ICD-10-CM | POA: Diagnosis not present

## 2017-03-27 DIAGNOSIS — J301 Allergic rhinitis due to pollen: Secondary | ICD-10-CM | POA: Diagnosis not present

## 2017-03-29 DIAGNOSIS — J3081 Allergic rhinitis due to animal (cat) (dog) hair and dander: Secondary | ICD-10-CM | POA: Diagnosis not present

## 2017-03-29 DIAGNOSIS — J3089 Other allergic rhinitis: Secondary | ICD-10-CM | POA: Diagnosis not present

## 2017-03-29 DIAGNOSIS — J301 Allergic rhinitis due to pollen: Secondary | ICD-10-CM | POA: Diagnosis not present

## 2017-04-02 ENCOUNTER — Other Ambulatory Visit: Payer: Self-pay | Admitting: Family Medicine

## 2017-04-02 DIAGNOSIS — R160 Hepatomegaly, not elsewhere classified: Secondary | ICD-10-CM

## 2017-04-03 DIAGNOSIS — J3081 Allergic rhinitis due to animal (cat) (dog) hair and dander: Secondary | ICD-10-CM | POA: Diagnosis not present

## 2017-04-03 DIAGNOSIS — J3089 Other allergic rhinitis: Secondary | ICD-10-CM | POA: Diagnosis not present

## 2017-04-03 DIAGNOSIS — J301 Allergic rhinitis due to pollen: Secondary | ICD-10-CM | POA: Diagnosis not present

## 2017-04-05 DIAGNOSIS — J301 Allergic rhinitis due to pollen: Secondary | ICD-10-CM | POA: Diagnosis not present

## 2017-04-05 DIAGNOSIS — J3081 Allergic rhinitis due to animal (cat) (dog) hair and dander: Secondary | ICD-10-CM | POA: Diagnosis not present

## 2017-04-05 DIAGNOSIS — J3089 Other allergic rhinitis: Secondary | ICD-10-CM | POA: Diagnosis not present

## 2017-04-09 ENCOUNTER — Ambulatory Visit
Admission: RE | Admit: 2017-04-09 | Discharge: 2017-04-09 | Disposition: A | Discharge status: Home / Self Care | Payer: 59 | Source: Ambulatory Visit | Attending: Family Medicine | Admitting: Family Medicine

## 2017-04-09 DIAGNOSIS — K7689 Other specified diseases of liver: Secondary | ICD-10-CM | POA: Diagnosis not present

## 2017-04-09 DIAGNOSIS — R160 Hepatomegaly, not elsewhere classified: Secondary | ICD-10-CM

## 2017-04-09 MED ORDER — GADOBENATE DIMEGLUMINE 529 MG/ML IV SOLN
14.0000 mL | Freq: Once | INTRAVENOUS | Status: AC | PRN
  Administered 2017-04-09: 14 mL via INTRAVENOUS

## 2017-04-10 DIAGNOSIS — J3089 Other allergic rhinitis: Secondary | ICD-10-CM | POA: Diagnosis not present

## 2017-04-10 DIAGNOSIS — J301 Allergic rhinitis due to pollen: Secondary | ICD-10-CM | POA: Diagnosis not present

## 2017-04-10 DIAGNOSIS — J3081 Allergic rhinitis due to animal (cat) (dog) hair and dander: Secondary | ICD-10-CM | POA: Diagnosis not present

## 2017-04-12 ENCOUNTER — Other Ambulatory Visit: Payer: 59

## 2017-04-16 DIAGNOSIS — J3089 Other allergic rhinitis: Secondary | ICD-10-CM | POA: Diagnosis not present

## 2017-04-16 DIAGNOSIS — J301 Allergic rhinitis due to pollen: Secondary | ICD-10-CM | POA: Diagnosis not present

## 2017-04-16 DIAGNOSIS — J3081 Allergic rhinitis due to animal (cat) (dog) hair and dander: Secondary | ICD-10-CM | POA: Diagnosis not present

## 2017-04-19 DIAGNOSIS — Z1211 Encounter for screening for malignant neoplasm of colon: Secondary | ICD-10-CM | POA: Diagnosis not present

## 2017-04-19 DIAGNOSIS — R1031 Right lower quadrant pain: Secondary | ICD-10-CM | POA: Diagnosis not present

## 2017-04-19 DIAGNOSIS — R110 Nausea: Secondary | ICD-10-CM | POA: Diagnosis not present

## 2017-04-25 DIAGNOSIS — J3081 Allergic rhinitis due to animal (cat) (dog) hair and dander: Secondary | ICD-10-CM | POA: Diagnosis not present

## 2017-04-25 DIAGNOSIS — J301 Allergic rhinitis due to pollen: Secondary | ICD-10-CM | POA: Diagnosis not present

## 2017-04-25 DIAGNOSIS — J3089 Other allergic rhinitis: Secondary | ICD-10-CM | POA: Diagnosis not present

## 2017-05-02 DIAGNOSIS — J301 Allergic rhinitis due to pollen: Secondary | ICD-10-CM | POA: Diagnosis not present

## 2017-05-02 DIAGNOSIS — J3081 Allergic rhinitis due to animal (cat) (dog) hair and dander: Secondary | ICD-10-CM | POA: Diagnosis not present

## 2017-05-02 DIAGNOSIS — J3089 Other allergic rhinitis: Secondary | ICD-10-CM | POA: Diagnosis not present

## 2017-05-08 DIAGNOSIS — J3081 Allergic rhinitis due to animal (cat) (dog) hair and dander: Secondary | ICD-10-CM | POA: Diagnosis not present

## 2017-05-08 DIAGNOSIS — J3089 Other allergic rhinitis: Secondary | ICD-10-CM | POA: Diagnosis not present

## 2017-05-08 DIAGNOSIS — J301 Allergic rhinitis due to pollen: Secondary | ICD-10-CM | POA: Diagnosis not present

## 2017-05-15 DIAGNOSIS — J3081 Allergic rhinitis due to animal (cat) (dog) hair and dander: Secondary | ICD-10-CM | POA: Diagnosis not present

## 2017-05-15 DIAGNOSIS — J3089 Other allergic rhinitis: Secondary | ICD-10-CM | POA: Diagnosis not present

## 2017-05-15 DIAGNOSIS — J301 Allergic rhinitis due to pollen: Secondary | ICD-10-CM | POA: Diagnosis not present

## 2017-05-19 DIAGNOSIS — S335XXA Sprain of ligaments of lumbar spine, initial encounter: Secondary | ICD-10-CM | POA: Diagnosis not present

## 2017-05-22 DIAGNOSIS — J3089 Other allergic rhinitis: Secondary | ICD-10-CM | POA: Diagnosis not present

## 2017-05-22 DIAGNOSIS — J301 Allergic rhinitis due to pollen: Secondary | ICD-10-CM | POA: Diagnosis not present

## 2017-05-22 DIAGNOSIS — R0602 Shortness of breath: Secondary | ICD-10-CM | POA: Diagnosis not present

## 2017-05-22 DIAGNOSIS — J3081 Allergic rhinitis due to animal (cat) (dog) hair and dander: Secondary | ICD-10-CM | POA: Diagnosis not present

## 2017-05-30 DIAGNOSIS — J301 Allergic rhinitis due to pollen: Secondary | ICD-10-CM | POA: Diagnosis not present

## 2017-05-30 DIAGNOSIS — J3081 Allergic rhinitis due to animal (cat) (dog) hair and dander: Secondary | ICD-10-CM | POA: Diagnosis not present

## 2017-05-30 DIAGNOSIS — J3089 Other allergic rhinitis: Secondary | ICD-10-CM | POA: Diagnosis not present

## 2017-05-31 DIAGNOSIS — K602 Anal fissure, unspecified: Secondary | ICD-10-CM | POA: Diagnosis not present

## 2017-05-31 DIAGNOSIS — K625 Hemorrhage of anus and rectum: Secondary | ICD-10-CM | POA: Diagnosis not present

## 2017-06-05 DIAGNOSIS — J3089 Other allergic rhinitis: Secondary | ICD-10-CM | POA: Diagnosis not present

## 2017-06-05 DIAGNOSIS — J301 Allergic rhinitis due to pollen: Secondary | ICD-10-CM | POA: Diagnosis not present

## 2017-06-05 DIAGNOSIS — J3081 Allergic rhinitis due to animal (cat) (dog) hair and dander: Secondary | ICD-10-CM | POA: Diagnosis not present

## 2017-06-13 DIAGNOSIS — J301 Allergic rhinitis due to pollen: Secondary | ICD-10-CM | POA: Diagnosis not present

## 2017-06-13 DIAGNOSIS — J3089 Other allergic rhinitis: Secondary | ICD-10-CM | POA: Diagnosis not present

## 2017-06-13 DIAGNOSIS — J3081 Allergic rhinitis due to animal (cat) (dog) hair and dander: Secondary | ICD-10-CM | POA: Diagnosis not present

## 2017-06-19 DIAGNOSIS — J3081 Allergic rhinitis due to animal (cat) (dog) hair and dander: Secondary | ICD-10-CM | POA: Diagnosis not present

## 2017-06-19 DIAGNOSIS — J301 Allergic rhinitis due to pollen: Secondary | ICD-10-CM | POA: Diagnosis not present

## 2017-06-19 DIAGNOSIS — J3089 Other allergic rhinitis: Secondary | ICD-10-CM | POA: Diagnosis not present

## 2017-06-28 DIAGNOSIS — J301 Allergic rhinitis due to pollen: Secondary | ICD-10-CM | POA: Diagnosis not present

## 2017-06-28 DIAGNOSIS — J3081 Allergic rhinitis due to animal (cat) (dog) hair and dander: Secondary | ICD-10-CM | POA: Diagnosis not present

## 2017-06-28 DIAGNOSIS — J3089 Other allergic rhinitis: Secondary | ICD-10-CM | POA: Diagnosis not present

## 2017-07-03 DIAGNOSIS — J3081 Allergic rhinitis due to animal (cat) (dog) hair and dander: Secondary | ICD-10-CM | POA: Diagnosis not present

## 2017-07-03 DIAGNOSIS — J301 Allergic rhinitis due to pollen: Secondary | ICD-10-CM | POA: Diagnosis not present

## 2017-07-03 DIAGNOSIS — J3089 Other allergic rhinitis: Secondary | ICD-10-CM | POA: Diagnosis not present

## 2017-07-12 DIAGNOSIS — J3089 Other allergic rhinitis: Secondary | ICD-10-CM | POA: Diagnosis not present

## 2017-07-12 DIAGNOSIS — J301 Allergic rhinitis due to pollen: Secondary | ICD-10-CM | POA: Diagnosis not present

## 2017-07-12 DIAGNOSIS — J3081 Allergic rhinitis due to animal (cat) (dog) hair and dander: Secondary | ICD-10-CM | POA: Diagnosis not present

## 2017-07-14 DIAGNOSIS — J0100 Acute maxillary sinusitis, unspecified: Secondary | ICD-10-CM | POA: Diagnosis not present

## 2017-07-16 DIAGNOSIS — J3089 Other allergic rhinitis: Secondary | ICD-10-CM | POA: Diagnosis not present

## 2017-07-16 DIAGNOSIS — J301 Allergic rhinitis due to pollen: Secondary | ICD-10-CM | POA: Diagnosis not present

## 2017-07-16 DIAGNOSIS — J3081 Allergic rhinitis due to animal (cat) (dog) hair and dander: Secondary | ICD-10-CM | POA: Diagnosis not present

## 2017-07-24 DIAGNOSIS — J3081 Allergic rhinitis due to animal (cat) (dog) hair and dander: Secondary | ICD-10-CM | POA: Diagnosis not present

## 2017-07-24 DIAGNOSIS — J3089 Other allergic rhinitis: Secondary | ICD-10-CM | POA: Diagnosis not present

## 2017-07-24 DIAGNOSIS — J301 Allergic rhinitis due to pollen: Secondary | ICD-10-CM | POA: Diagnosis not present

## 2017-07-26 DIAGNOSIS — J019 Acute sinusitis, unspecified: Secondary | ICD-10-CM | POA: Diagnosis not present

## 2017-07-27 ENCOUNTER — Other Ambulatory Visit: Payer: Self-pay | Admitting: Family Medicine

## 2017-07-27 DIAGNOSIS — K7689 Other specified diseases of liver: Secondary | ICD-10-CM

## 2017-08-01 DIAGNOSIS — J3081 Allergic rhinitis due to animal (cat) (dog) hair and dander: Secondary | ICD-10-CM | POA: Diagnosis not present

## 2017-08-01 DIAGNOSIS — J301 Allergic rhinitis due to pollen: Secondary | ICD-10-CM | POA: Diagnosis not present

## 2017-08-01 DIAGNOSIS — J3089 Other allergic rhinitis: Secondary | ICD-10-CM | POA: Diagnosis not present

## 2017-08-07 DIAGNOSIS — J3089 Other allergic rhinitis: Secondary | ICD-10-CM | POA: Diagnosis not present

## 2017-08-07 DIAGNOSIS — J301 Allergic rhinitis due to pollen: Secondary | ICD-10-CM | POA: Diagnosis not present

## 2017-08-07 DIAGNOSIS — J3081 Allergic rhinitis due to animal (cat) (dog) hair and dander: Secondary | ICD-10-CM | POA: Diagnosis not present

## 2017-08-13 DIAGNOSIS — J3089 Other allergic rhinitis: Secondary | ICD-10-CM | POA: Diagnosis not present

## 2017-08-13 DIAGNOSIS — J301 Allergic rhinitis due to pollen: Secondary | ICD-10-CM | POA: Diagnosis not present

## 2017-08-13 DIAGNOSIS — J3081 Allergic rhinitis due to animal (cat) (dog) hair and dander: Secondary | ICD-10-CM | POA: Diagnosis not present

## 2017-08-16 DIAGNOSIS — J301 Allergic rhinitis due to pollen: Secondary | ICD-10-CM | POA: Diagnosis not present

## 2017-08-16 DIAGNOSIS — J3089 Other allergic rhinitis: Secondary | ICD-10-CM | POA: Diagnosis not present

## 2017-08-16 DIAGNOSIS — J3081 Allergic rhinitis due to animal (cat) (dog) hair and dander: Secondary | ICD-10-CM | POA: Diagnosis not present

## 2017-08-20 DIAGNOSIS — J3089 Other allergic rhinitis: Secondary | ICD-10-CM | POA: Diagnosis not present

## 2017-08-20 DIAGNOSIS — J301 Allergic rhinitis due to pollen: Secondary | ICD-10-CM | POA: Diagnosis not present

## 2017-08-20 DIAGNOSIS — J3081 Allergic rhinitis due to animal (cat) (dog) hair and dander: Secondary | ICD-10-CM | POA: Diagnosis not present

## 2017-08-22 DIAGNOSIS — J301 Allergic rhinitis due to pollen: Secondary | ICD-10-CM | POA: Diagnosis not present

## 2017-08-22 DIAGNOSIS — J3081 Allergic rhinitis due to animal (cat) (dog) hair and dander: Secondary | ICD-10-CM | POA: Diagnosis not present

## 2017-08-22 DIAGNOSIS — J3089 Other allergic rhinitis: Secondary | ICD-10-CM | POA: Diagnosis not present

## 2017-08-23 ENCOUNTER — Ambulatory Visit
Admission: RE | Admit: 2017-08-23 | Discharge: 2017-08-23 | Disposition: A | Discharge status: Home / Self Care | Payer: 59 | Source: Ambulatory Visit | Attending: Family Medicine | Admitting: Family Medicine

## 2017-08-23 DIAGNOSIS — K7689 Other specified diseases of liver: Secondary | ICD-10-CM

## 2017-08-23 DIAGNOSIS — N951 Menopausal and female climacteric states: Secondary | ICD-10-CM | POA: Diagnosis not present

## 2017-08-23 MED ORDER — GADOXETATE DISODIUM 0.25 MMOL/ML IV SOLN
7.0000 mL | Freq: Once | INTRAVENOUS | Status: AC | PRN
  Administered 2017-08-23: 7 mL via INTRAVENOUS

## 2017-08-27 DIAGNOSIS — J3081 Allergic rhinitis due to animal (cat) (dog) hair and dander: Secondary | ICD-10-CM | POA: Diagnosis not present

## 2017-08-27 DIAGNOSIS — J301 Allergic rhinitis due to pollen: Secondary | ICD-10-CM | POA: Diagnosis not present

## 2017-08-27 DIAGNOSIS — J3089 Other allergic rhinitis: Secondary | ICD-10-CM | POA: Diagnosis not present

## 2017-08-29 DIAGNOSIS — J301 Allergic rhinitis due to pollen: Secondary | ICD-10-CM | POA: Diagnosis not present

## 2017-08-29 DIAGNOSIS — J3089 Other allergic rhinitis: Secondary | ICD-10-CM | POA: Diagnosis not present

## 2017-08-29 DIAGNOSIS — J3081 Allergic rhinitis due to animal (cat) (dog) hair and dander: Secondary | ICD-10-CM | POA: Diagnosis not present

## 2017-09-03 DIAGNOSIS — J3081 Allergic rhinitis due to animal (cat) (dog) hair and dander: Secondary | ICD-10-CM | POA: Diagnosis not present

## 2017-09-03 DIAGNOSIS — J3089 Other allergic rhinitis: Secondary | ICD-10-CM | POA: Diagnosis not present

## 2017-09-03 DIAGNOSIS — J301 Allergic rhinitis due to pollen: Secondary | ICD-10-CM | POA: Diagnosis not present

## 2017-09-05 DIAGNOSIS — J3089 Other allergic rhinitis: Secondary | ICD-10-CM | POA: Diagnosis not present

## 2017-09-05 DIAGNOSIS — J301 Allergic rhinitis due to pollen: Secondary | ICD-10-CM | POA: Diagnosis not present

## 2017-09-05 DIAGNOSIS — J029 Acute pharyngitis, unspecified: Secondary | ICD-10-CM | POA: Diagnosis not present

## 2017-09-05 DIAGNOSIS — J3081 Allergic rhinitis due to animal (cat) (dog) hair and dander: Secondary | ICD-10-CM | POA: Diagnosis not present

## 2017-09-10 DIAGNOSIS — J3081 Allergic rhinitis due to animal (cat) (dog) hair and dander: Secondary | ICD-10-CM | POA: Diagnosis not present

## 2017-09-10 DIAGNOSIS — J301 Allergic rhinitis due to pollen: Secondary | ICD-10-CM | POA: Diagnosis not present

## 2017-09-10 DIAGNOSIS — J3089 Other allergic rhinitis: Secondary | ICD-10-CM | POA: Diagnosis not present

## 2017-09-12 DIAGNOSIS — Z0001 Encounter for general adult medical examination with abnormal findings: Secondary | ICD-10-CM | POA: Diagnosis not present

## 2017-09-12 DIAGNOSIS — N951 Menopausal and female climacteric states: Secondary | ICD-10-CM | POA: Diagnosis not present

## 2017-09-12 DIAGNOSIS — N941 Unspecified dyspareunia: Secondary | ICD-10-CM | POA: Diagnosis not present

## 2017-09-12 DIAGNOSIS — J3081 Allergic rhinitis due to animal (cat) (dog) hair and dander: Secondary | ICD-10-CM | POA: Diagnosis not present

## 2017-09-12 DIAGNOSIS — J301 Allergic rhinitis due to pollen: Secondary | ICD-10-CM | POA: Diagnosis not present

## 2017-09-12 DIAGNOSIS — K7689 Other specified diseases of liver: Secondary | ICD-10-CM | POA: Diagnosis not present

## 2017-09-19 DIAGNOSIS — J3081 Allergic rhinitis due to animal (cat) (dog) hair and dander: Secondary | ICD-10-CM | POA: Diagnosis not present

## 2017-09-19 DIAGNOSIS — J3089 Other allergic rhinitis: Secondary | ICD-10-CM | POA: Diagnosis not present

## 2017-09-19 DIAGNOSIS — J301 Allergic rhinitis due to pollen: Secondary | ICD-10-CM | POA: Diagnosis not present

## 2017-09-25 DIAGNOSIS — J3081 Allergic rhinitis due to animal (cat) (dog) hair and dander: Secondary | ICD-10-CM | POA: Diagnosis not present

## 2017-09-25 DIAGNOSIS — J301 Allergic rhinitis due to pollen: Secondary | ICD-10-CM | POA: Diagnosis not present

## 2017-09-25 DIAGNOSIS — J3089 Other allergic rhinitis: Secondary | ICD-10-CM | POA: Diagnosis not present

## 2017-09-26 DIAGNOSIS — J029 Acute pharyngitis, unspecified: Secondary | ICD-10-CM | POA: Diagnosis not present

## 2017-10-01 DIAGNOSIS — J3089 Other allergic rhinitis: Secondary | ICD-10-CM | POA: Diagnosis not present

## 2017-10-01 DIAGNOSIS — J301 Allergic rhinitis due to pollen: Secondary | ICD-10-CM | POA: Diagnosis not present

## 2017-10-01 DIAGNOSIS — J3081 Allergic rhinitis due to animal (cat) (dog) hair and dander: Secondary | ICD-10-CM | POA: Diagnosis not present

## 2017-10-08 ENCOUNTER — Other Ambulatory Visit: Payer: 59

## 2017-10-08 DIAGNOSIS — J3081 Allergic rhinitis due to animal (cat) (dog) hair and dander: Secondary | ICD-10-CM | POA: Diagnosis not present

## 2017-10-08 DIAGNOSIS — J301 Allergic rhinitis due to pollen: Secondary | ICD-10-CM | POA: Diagnosis not present

## 2017-10-08 DIAGNOSIS — J3089 Other allergic rhinitis: Secondary | ICD-10-CM | POA: Diagnosis not present

## 2017-10-15 DIAGNOSIS — J3081 Allergic rhinitis due to animal (cat) (dog) hair and dander: Secondary | ICD-10-CM | POA: Diagnosis not present

## 2017-10-15 DIAGNOSIS — J301 Allergic rhinitis due to pollen: Secondary | ICD-10-CM | POA: Diagnosis not present

## 2017-10-15 DIAGNOSIS — J3089 Other allergic rhinitis: Secondary | ICD-10-CM | POA: Diagnosis not present

## 2017-10-17 DIAGNOSIS — Z1211 Encounter for screening for malignant neoplasm of colon: Secondary | ICD-10-CM | POA: Diagnosis not present

## 2017-10-17 DIAGNOSIS — K5901 Slow transit constipation: Secondary | ICD-10-CM | POA: Diagnosis not present

## 2017-10-17 DIAGNOSIS — K602 Anal fissure, unspecified: Secondary | ICD-10-CM | POA: Diagnosis not present

## 2017-10-24 DIAGNOSIS — J301 Allergic rhinitis due to pollen: Secondary | ICD-10-CM | POA: Diagnosis not present

## 2017-10-24 DIAGNOSIS — J3089 Other allergic rhinitis: Secondary | ICD-10-CM | POA: Diagnosis not present

## 2017-10-24 DIAGNOSIS — J3081 Allergic rhinitis due to animal (cat) (dog) hair and dander: Secondary | ICD-10-CM | POA: Diagnosis not present

## 2017-10-26 DIAGNOSIS — J069 Acute upper respiratory infection, unspecified: Secondary | ICD-10-CM | POA: Diagnosis not present

## 2017-10-29 DIAGNOSIS — J3089 Other allergic rhinitis: Secondary | ICD-10-CM | POA: Diagnosis not present

## 2017-10-29 DIAGNOSIS — J301 Allergic rhinitis due to pollen: Secondary | ICD-10-CM | POA: Diagnosis not present

## 2017-10-29 DIAGNOSIS — J3081 Allergic rhinitis due to animal (cat) (dog) hair and dander: Secondary | ICD-10-CM | POA: Diagnosis not present

## 2017-11-06 DIAGNOSIS — R070 Pain in throat: Secondary | ICD-10-CM | POA: Insufficient documentation

## 2017-11-06 DIAGNOSIS — K219 Gastro-esophageal reflux disease without esophagitis: Secondary | ICD-10-CM | POA: Insufficient documentation

## 2017-11-25 DIAGNOSIS — J3081 Allergic rhinitis due to animal (cat) (dog) hair and dander: Secondary | ICD-10-CM | POA: Diagnosis not present

## 2017-11-25 DIAGNOSIS — J301 Allergic rhinitis due to pollen: Secondary | ICD-10-CM | POA: Diagnosis not present

## 2017-11-26 DIAGNOSIS — J3081 Allergic rhinitis due to animal (cat) (dog) hair and dander: Secondary | ICD-10-CM | POA: Diagnosis not present

## 2017-11-26 DIAGNOSIS — J3089 Other allergic rhinitis: Secondary | ICD-10-CM | POA: Diagnosis not present

## 2017-11-26 DIAGNOSIS — J301 Allergic rhinitis due to pollen: Secondary | ICD-10-CM | POA: Diagnosis not present

## 2017-11-28 DIAGNOSIS — J3081 Allergic rhinitis due to animal (cat) (dog) hair and dander: Secondary | ICD-10-CM | POA: Diagnosis not present

## 2017-11-28 DIAGNOSIS — J301 Allergic rhinitis due to pollen: Secondary | ICD-10-CM | POA: Diagnosis not present

## 2017-11-28 DIAGNOSIS — J3089 Other allergic rhinitis: Secondary | ICD-10-CM | POA: Diagnosis not present

## 2017-11-29 DIAGNOSIS — Z1211 Encounter for screening for malignant neoplasm of colon: Secondary | ICD-10-CM | POA: Diagnosis not present

## 2017-12-02 DIAGNOSIS — J018 Other acute sinusitis: Secondary | ICD-10-CM | POA: Diagnosis not present

## 2017-12-03 DIAGNOSIS — J3089 Other allergic rhinitis: Secondary | ICD-10-CM | POA: Diagnosis not present

## 2017-12-03 DIAGNOSIS — J301 Allergic rhinitis due to pollen: Secondary | ICD-10-CM | POA: Diagnosis not present

## 2017-12-03 DIAGNOSIS — J3081 Allergic rhinitis due to animal (cat) (dog) hair and dander: Secondary | ICD-10-CM | POA: Diagnosis not present

## 2017-12-05 DIAGNOSIS — J301 Allergic rhinitis due to pollen: Secondary | ICD-10-CM | POA: Diagnosis not present

## 2017-12-05 DIAGNOSIS — J3081 Allergic rhinitis due to animal (cat) (dog) hair and dander: Secondary | ICD-10-CM | POA: Diagnosis not present

## 2017-12-05 DIAGNOSIS — J3089 Other allergic rhinitis: Secondary | ICD-10-CM | POA: Diagnosis not present

## 2017-12-10 DIAGNOSIS — J3089 Other allergic rhinitis: Secondary | ICD-10-CM | POA: Diagnosis not present

## 2017-12-10 DIAGNOSIS — J3081 Allergic rhinitis due to animal (cat) (dog) hair and dander: Secondary | ICD-10-CM | POA: Diagnosis not present

## 2017-12-10 DIAGNOSIS — J301 Allergic rhinitis due to pollen: Secondary | ICD-10-CM | POA: Diagnosis not present

## 2017-12-17 DIAGNOSIS — J3081 Allergic rhinitis due to animal (cat) (dog) hair and dander: Secondary | ICD-10-CM | POA: Diagnosis not present

## 2017-12-17 DIAGNOSIS — J3089 Other allergic rhinitis: Secondary | ICD-10-CM | POA: Diagnosis not present

## 2017-12-17 DIAGNOSIS — J301 Allergic rhinitis due to pollen: Secondary | ICD-10-CM | POA: Diagnosis not present

## 2017-12-26 DIAGNOSIS — J3089 Other allergic rhinitis: Secondary | ICD-10-CM | POA: Diagnosis not present

## 2017-12-26 DIAGNOSIS — J301 Allergic rhinitis due to pollen: Secondary | ICD-10-CM | POA: Diagnosis not present

## 2017-12-26 DIAGNOSIS — J3081 Allergic rhinitis due to animal (cat) (dog) hair and dander: Secondary | ICD-10-CM | POA: Diagnosis not present

## 2018-01-01 ENCOUNTER — Encounter (HOSPITAL_COMMUNITY): Payer: Self-pay | Admitting: Emergency Medicine

## 2018-01-01 ENCOUNTER — Other Ambulatory Visit: Payer: Self-pay

## 2018-01-01 ENCOUNTER — Emergency Department (HOSPITAL_COMMUNITY): Payer: 59 | Admitting: Anesthesiology

## 2018-01-01 ENCOUNTER — Emergency Department (HOSPITAL_COMMUNITY): Payer: 59

## 2018-01-01 ENCOUNTER — Observation Stay (HOSPITAL_COMMUNITY)
Admission: EM | Admit: 2018-01-01 | Discharge: 2018-01-01 | Disposition: A | Discharge status: Home / Self Care | Payer: 59 | Attending: General Surgery | Admitting: General Surgery

## 2018-01-01 ENCOUNTER — Encounter (HOSPITAL_COMMUNITY)
Admission: EM | Disposition: A | Discharge status: Home / Self Care | Payer: Self-pay | Source: Home / Self Care | Attending: Emergency Medicine

## 2018-01-01 DIAGNOSIS — Z79899 Other long term (current) drug therapy: Secondary | ICD-10-CM | POA: Insufficient documentation

## 2018-01-01 DIAGNOSIS — R110 Nausea: Secondary | ICD-10-CM | POA: Diagnosis not present

## 2018-01-01 DIAGNOSIS — K3580 Unspecified acute appendicitis: Principal | ICD-10-CM | POA: Diagnosis present

## 2018-01-01 DIAGNOSIS — R1031 Right lower quadrant pain: Secondary | ICD-10-CM | POA: Diagnosis not present

## 2018-01-01 DIAGNOSIS — Z793 Long term (current) use of hormonal contraceptives: Secondary | ICD-10-CM | POA: Diagnosis not present

## 2018-01-01 DIAGNOSIS — Z9049 Acquired absence of other specified parts of digestive tract: Secondary | ICD-10-CM | POA: Insufficient documentation

## 2018-01-01 DIAGNOSIS — R109 Unspecified abdominal pain: Secondary | ICD-10-CM | POA: Diagnosis not present

## 2018-01-01 HISTORY — PX: LAPAROSCOPIC APPENDECTOMY: SHX408

## 2018-01-01 LAB — URINALYSIS, ROUTINE W REFLEX MICROSCOPIC
Bilirubin Urine: NEGATIVE
Glucose, UA: NEGATIVE mg/dL
Hgb urine dipstick: NEGATIVE
Ketones, ur: NEGATIVE mg/dL
Nitrite: NEGATIVE
Protein, ur: NEGATIVE mg/dL
Specific Gravity, Urine: 1.023 (ref 1.005–1.030)
pH: 6 (ref 5.0–8.0)

## 2018-01-01 LAB — COMPREHENSIVE METABOLIC PANEL
ALT: 17 U/L (ref 14–54)
AST: 20 U/L (ref 15–41)
Albumin: 4 g/dL (ref 3.5–5.0)
Alkaline Phosphatase: 64 U/L (ref 38–126)
Anion gap: 6 (ref 5–15)
BUN: 13 mg/dL (ref 6–20)
CO2: 27 mmol/L (ref 22–32)
Calcium: 8.9 mg/dL (ref 8.9–10.3)
Chloride: 106 mmol/L (ref 101–111)
Creatinine, Ser: 0.86 mg/dL (ref 0.44–1.00)
GFR calc Af Amer: >60 mL/min (ref >60)
GFR calc non Af Amer: >60 mL/min (ref >60)
Glucose, Bld: 132 mg/dL — ABNORMAL HIGH (ref 65–99)
Potassium: 4 mmol/L (ref 3.5–5.1)
Sodium: 139 mmol/L (ref 135–145)
Total Bilirubin: 0.4 mg/dL (ref 0.3–1.2)
Total Protein: 7.7 g/dL (ref 6.5–8.1)

## 2018-01-01 LAB — CBC WITH DIFFERENTIAL/PLATELET
Basophils Absolute: 0 10*3/uL (ref 0.0–0.1)
Basophils Relative: 0 %
Eosinophils Absolute: 0.1 10*3/uL (ref 0.0–0.7)
Eosinophils Relative: 1 %
HCT: 37.2 % (ref 36.0–46.0)
Hemoglobin: 12.3 g/dL (ref 12.0–15.0)
Lymphocytes Relative: 11 %
Lymphs Abs: 1.5 10*3/uL (ref 0.7–4.0)
MCH: 30.7 pg (ref 26.0–34.0)
MCHC: 33.1 g/dL (ref 30.0–36.0)
MCV: 92.8 fL (ref 78.0–100.0)
Monocytes Absolute: 0.5 10*3/uL (ref 0.1–1.0)
Monocytes Relative: 4 %
Neutro Abs: 12 10*3/uL — ABNORMAL HIGH (ref 1.7–7.7)
Neutrophils Relative %: 84 %
Platelets: 256 10*3/uL (ref 150–400)
RBC: 4.01 MIL/uL (ref 3.87–5.11)
RDW: 12.6 % (ref 11.5–15.5)
WBC: 14.2 10*3/uL — ABNORMAL HIGH (ref 4.0–10.5)

## 2018-01-01 LAB — I-STAT BETA HCG BLOOD, ED (MC, WL, AP ONLY): I-stat hCG, quantitative: <5 m[IU]/mL (ref <5)

## 2018-01-01 LAB — LIPASE, BLOOD: Lipase: 29 U/L (ref 11–51)

## 2018-01-01 SURGERY — APPENDECTOMY, LAPAROSCOPIC
Anesthesia: General | Site: Abdomen

## 2018-01-01 MED ORDER — ONDANSETRON HCL 4 MG/2ML IJ SOLN
4.0000 mg | Freq: Once | INTRAMUSCULAR | Status: AC
  Administered 2018-01-01: 4 mg via INTRAVENOUS
  Filled 2018-01-01: qty 2

## 2018-01-01 MED ORDER — DEXAMETHASONE SODIUM PHOSPHATE 10 MG/ML IJ SOLN
INTRAMUSCULAR | Status: AC
  Filled 2018-01-01: qty 1

## 2018-01-01 MED ORDER — PROMETHAZINE HCL 25 MG/ML IJ SOLN
INTRAMUSCULAR | Status: AC
  Filled 2018-01-01: qty 1

## 2018-01-01 MED ORDER — FENTANYL CITRATE (PF) 250 MCG/5ML IJ SOLN
INTRAMUSCULAR | Status: AC
  Filled 2018-01-01: qty 5

## 2018-01-01 MED ORDER — BUPIVACAINE HCL (PF) 0.5 % IJ SOLN
INTRAMUSCULAR | Status: AC
  Filled 2018-01-01: qty 30

## 2018-01-01 MED ORDER — HYDROMORPHONE HCL 1 MG/ML IJ SOLN: 0.2500 mg | INTRAMUSCULAR | Status: DC | PRN

## 2018-01-01 MED ORDER — OXYCODONE HCL 5 MG PO TABS: 5.0000 mg | ORAL_TABLET | ORAL | Status: DC | PRN

## 2018-01-01 MED ORDER — HYDROCODONE-ACETAMINOPHEN 7.5-325 MG PO TABS: 1.0000 | ORAL_TABLET | Freq: Once | ORAL | Status: DC | PRN

## 2018-01-01 MED ORDER — PANTOPRAZOLE SODIUM 40 MG PO TBEC
40.0000 mg | DELAYED_RELEASE_TABLET | Freq: Every day | ORAL | Status: DC
  Administered 2018-01-01: 40 mg via ORAL
  Filled 2018-01-01: qty 1

## 2018-01-01 MED ORDER — IBUPROFEN 400 MG PO TABS: 400.0000 mg | ORAL_TABLET | Freq: Three times a day (TID) | ORAL | 0 refills | Status: AC

## 2018-01-01 MED ORDER — ONDANSETRON HCL 4 MG/2ML IJ SOLN
INTRAMUSCULAR | Status: AC
  Filled 2018-01-01: qty 2

## 2018-01-01 MED ORDER — OXYCODONE HCL 5 MG PO TABS: 5.0000 mg | ORAL_TABLET | Freq: Four times a day (QID) | ORAL | 0 refills | Status: DC | PRN

## 2018-01-01 MED ORDER — DEXAMETHASONE SODIUM PHOSPHATE 10 MG/ML IJ SOLN
INTRAMUSCULAR | Status: DC | PRN
  Administered 2018-01-01: 10 mg via INTRAVENOUS

## 2018-01-01 MED ORDER — 0.9 % SODIUM CHLORIDE (POUR BTL) OPTIME
TOPICAL | Status: DC | PRN
  Administered 2018-01-01: 1000 mL

## 2018-01-01 MED ORDER — MIDAZOLAM HCL 5 MG/5ML IJ SOLN
INTRAMUSCULAR | Status: DC | PRN
  Administered 2018-01-01: 2 mg via INTRAVENOUS

## 2018-01-01 MED ORDER — CEFTRIAXONE SODIUM 2 G IJ SOLR
2.0000 g | Freq: Once | INTRAMUSCULAR | Status: AC
  Administered 2018-01-01: 2 g via INTRAVENOUS
  Filled 2018-01-01: qty 20

## 2018-01-01 MED ORDER — ACETAMINOPHEN 10 MG/ML IV SOLN: 1000.0000 mg | Freq: Once | INTRAVENOUS | Status: DC | PRN

## 2018-01-01 MED ORDER — SCOPOLAMINE 1 MG/3DAYS TD PT72
1.0000 | MEDICATED_PATCH | TRANSDERMAL | Status: DC
  Administered 2018-01-01: 1.5 mg via TRANSDERMAL

## 2018-01-01 MED ORDER — PROPOFOL 10 MG/ML IV BOLUS
INTRAVENOUS | Status: AC
  Filled 2018-01-01: qty 20

## 2018-01-01 MED ORDER — FENTANYL CITRATE (PF) 100 MCG/2ML IJ SOLN
INTRAMUSCULAR | Status: DC | PRN
  Administered 2018-01-01 (×2): 50 ug via INTRAVENOUS
  Administered 2018-01-01: 100 ug via INTRAVENOUS
  Administered 2018-01-01: 50 ug via INTRAVENOUS

## 2018-01-01 MED ORDER — LIDOCAINE 2% (20 MG/ML) 5 ML SYRINGE
INTRAMUSCULAR | Status: AC
  Filled 2018-01-01: qty 5

## 2018-01-01 MED ORDER — ACETAMINOPHEN 500 MG PO TABS
1000.0000 mg | ORAL_TABLET | Freq: Three times a day (TID) | ORAL | Status: DC
  Administered 2018-01-01: 1000 mg via ORAL
  Filled 2018-01-01: qty 2

## 2018-01-01 MED ORDER — SUCCINYLCHOLINE CHLORIDE 200 MG/10ML IV SOSY
PREFILLED_SYRINGE | INTRAVENOUS | Status: DC | PRN
  Administered 2018-01-01: 120 mg via INTRAVENOUS

## 2018-01-01 MED ORDER — LACTATED RINGERS IR SOLN
Status: DC | PRN
  Administered 2018-01-01: 1000 mL

## 2018-01-01 MED ORDER — MIDAZOLAM HCL 2 MG/2ML IJ SOLN
INTRAMUSCULAR | Status: AC
  Filled 2018-01-01: qty 2

## 2018-01-01 MED ORDER — MEPERIDINE HCL 50 MG/ML IJ SOLN: 6.2500 mg | INTRAMUSCULAR | Status: DC | PRN

## 2018-01-01 MED ORDER — IOPAMIDOL (ISOVUE-300) INJECTION 61%
INTRAVENOUS | Status: AC
  Filled 2018-01-01: qty 100

## 2018-01-01 MED ORDER — SUCCINYLCHOLINE CHLORIDE 200 MG/10ML IV SOSY
PREFILLED_SYRINGE | INTRAVENOUS | Status: AC
  Filled 2018-01-01: qty 10

## 2018-01-01 MED ORDER — MORPHINE SULFATE (PF) 4 MG/ML IV SOLN
4.0000 mg | Freq: Once | INTRAVENOUS | Status: AC
  Administered 2018-01-01: 4 mg via INTRAVENOUS
  Filled 2018-01-01: qty 1

## 2018-01-01 MED ORDER — IBUPROFEN 200 MG PO TABS
400.0000 mg | ORAL_TABLET | Freq: Three times a day (TID) | ORAL | Status: DC
  Administered 2018-01-01: 400 mg via ORAL
  Filled 2018-01-01: qty 2

## 2018-01-01 MED ORDER — ACETAMINOPHEN 500 MG PO TABS: 1000.0000 mg | ORAL_TABLET | Freq: Three times a day (TID) | ORAL | 0 refills | Status: DC

## 2018-01-01 MED ORDER — METRONIDAZOLE IN NACL 5-0.79 MG/ML-% IV SOLN
500.0000 mg | Freq: Once | INTRAVENOUS | Status: AC
  Administered 2018-01-01: 500 mg via INTRAVENOUS
  Filled 2018-01-01: qty 100

## 2018-01-01 MED ORDER — ONDANSETRON HCL 4 MG/2ML IJ SOLN
INTRAMUSCULAR | Status: DC | PRN
  Administered 2018-01-01: 4 mg via INTRAVENOUS

## 2018-01-01 MED ORDER — ENOXAPARIN SODIUM 40 MG/0.4ML ~~LOC~~ SOLN: 40.0000 mg | SUBCUTANEOUS | Status: DC

## 2018-01-01 MED ORDER — ROCURONIUM BROMIDE 10 MG/ML (PF) SYRINGE
PREFILLED_SYRINGE | INTRAVENOUS | Status: DC | PRN
  Administered 2018-01-01: 30 mg via INTRAVENOUS

## 2018-01-01 MED ORDER — SCOPOLAMINE 1 MG/3DAYS TD PT72
MEDICATED_PATCH | TRANSDERMAL | Status: AC
  Filled 2018-01-01: qty 1

## 2018-01-01 MED ORDER — LACTATED RINGERS IV SOLN
INTRAVENOUS | Status: DC
  Administered 2018-01-01: 09:00:00 via INTRAVENOUS

## 2018-01-01 MED ORDER — IOPAMIDOL (ISOVUE-300) INJECTION 61%
100.0000 mL | Freq: Once | INTRAVENOUS | Status: AC | PRN
  Administered 2018-01-01: 100 mL via INTRAVENOUS

## 2018-01-01 MED ORDER — LIDOCAINE 2% (20 MG/ML) 5 ML SYRINGE
INTRAMUSCULAR | Status: DC | PRN
  Administered 2018-01-01: 100 mg via INTRAVENOUS

## 2018-01-01 MED ORDER — PROMETHAZINE HCL 25 MG/ML IJ SOLN
6.2500 mg | INTRAMUSCULAR | Status: DC | PRN
  Administered 2018-01-01: 12.5 mg via INTRAVENOUS

## 2018-01-01 MED ORDER — BUPIVACAINE HCL 0.25 % IJ SOLN
INTRAMUSCULAR | Status: DC | PRN
  Administered 2018-01-01: 12 mL
  Administered 2018-01-01: 5 mL

## 2018-01-01 MED ORDER — ROCURONIUM BROMIDE 10 MG/ML (PF) SYRINGE
PREFILLED_SYRINGE | INTRAVENOUS | Status: AC
  Filled 2018-01-01: qty 5

## 2018-01-01 MED ORDER — LEVONORGESTREL-ETHINYL ESTRAD 0.1-20 MG-MCG PO TABS: 1.0000 | ORAL_TABLET | Freq: Every day | ORAL | Status: DC

## 2018-01-01 MED ORDER — PROPOFOL 10 MG/ML IV BOLUS
INTRAVENOUS | Status: DC | PRN
  Administered 2018-01-01: 180 mg via INTRAVENOUS

## 2018-01-01 SURGICAL SUPPLY — 45 items
ADH SKN CLS APL DERMABOND .7 (GAUZE/BANDAGES/DRESSINGS) ×1
BAG SPEC RTRVL 10 TROC 200 (ENDOMECHANICALS) ×1
BAG SPEC RTRVL LRG 6X4 10 (ENDOMECHANICALS)
CABLE HIGH FREQUENCY MONO STRZ (ELECTRODE) ×2 IMPLANT
CHLORAPREP W/TINT 26ML (MISCELLANEOUS) ×2 IMPLANT
COVER SURGICAL LIGHT HANDLE (MISCELLANEOUS) ×2 IMPLANT
CUTTER FLEX LINEAR 45M (STAPLE) ×2 IMPLANT
DECANTER SPIKE VIAL GLASS SM (MISCELLANEOUS) ×2 IMPLANT
DERMABOND ADVANCED (GAUZE/BANDAGES/DRESSINGS) ×1
DERMABOND ADVANCED .7 DNX12 (GAUZE/BANDAGES/DRESSINGS) ×1 IMPLANT
DRAPE LAPAROSCOPIC ABDOMINAL (DRAPES) ×2 IMPLANT
ELECT PENCIL ROCKER SW 15FT (MISCELLANEOUS) IMPLANT
ELECT REM PT RETURN 15FT ADLT (MISCELLANEOUS) ×2 IMPLANT
GLOVE BIO SURGEON STRL SZ7 (GLOVE) ×4 IMPLANT
GLOVE BIOGEL PI IND STRL 7.0 (GLOVE) ×1 IMPLANT
GLOVE BIOGEL PI IND STRL 7.5 (GLOVE) ×2 IMPLANT
GLOVE BIOGEL PI INDICATOR 7.0 (GLOVE) ×1
GLOVE BIOGEL PI INDICATOR 7.5 (GLOVE) ×2
GOWN STRL REUS W/ TWL XL LVL3 (GOWN DISPOSABLE) ×1 IMPLANT
GOWN STRL REUS W/TWL LRG LVL3 (GOWN DISPOSABLE) ×4 IMPLANT
GOWN STRL REUS W/TWL XL LVL3 (GOWN DISPOSABLE) ×4 IMPLANT
GRASPER SUT TROCAR 14GX15 (MISCELLANEOUS) ×1 IMPLANT
KIT BASIN OR (CUSTOM PROCEDURE TRAY) ×2 IMPLANT
NS IRRIG 1000ML POUR BTL (IV SOLUTION) ×2 IMPLANT
POUCH RETRIEVAL ECOSAC 10 (ENDOMECHANICALS) ×1 IMPLANT
POUCH RETRIEVAL ECOSAC 10MM (ENDOMECHANICALS) ×1
POUCH SPECIMEN RETRIEVAL 10MM (ENDOMECHANICALS) IMPLANT
RELOAD 45 VASCULAR/THIN (ENDOMECHANICALS) IMPLANT
RELOAD STAPLE 45 2.5 WHT GRN (ENDOMECHANICALS) IMPLANT
RELOAD STAPLE 45 3.5 BLU ETS (ENDOMECHANICALS) ×1 IMPLANT
RELOAD STAPLE TA45 3.5 REG BLU (ENDOMECHANICALS) ×2 IMPLANT
SET IRRIG TUBING LAPAROSCOPIC (IRRIGATION / IRRIGATOR) ×2 IMPLANT
SHEARS HARMONIC ACE PLUS 36CM (ENDOMECHANICALS) ×2 IMPLANT
SOLUTION ANTI FOG 6CC (MISCELLANEOUS) ×2 IMPLANT
STRIP CLOSURE SKIN 1/2X4 (GAUZE/BANDAGES/DRESSINGS) ×1 IMPLANT
SUT MNCRL AB 4-0 PS2 18 (SUTURE) ×2 IMPLANT
SUT VICRYL 0 ENDOLOOP (SUTURE) IMPLANT
SUT VICRYL 0 UR6 27IN ABS (SUTURE) ×1 IMPLANT
TOWEL OR 17X26 10 PK STRL BLUE (TOWEL DISPOSABLE) ×2 IMPLANT
TOWEL OR NON WOVEN STRL DISP B (DISPOSABLE) ×2 IMPLANT
TRAY FOLEY MTR SLVR 14FR STAT (SET/KITS/TRAYS/PACK) ×2 IMPLANT
TRAY FOLEY MTR SLVR 16FR STAT (SET/KITS/TRAYS/PACK) IMPLANT
TRAY LAPAROSCOPIC (CUSTOM PROCEDURE TRAY) ×2 IMPLANT
TROCAR XCEL BLUNT TIP 100MML (ENDOMECHANICALS) ×2 IMPLANT
TUBING INSUF HEATED (TUBING) ×2 IMPLANT

## 2018-01-01 NOTE — ED Triage Notes (Signed)
Pt c/o RLQ abd pain after eating [place] food this evening . Denies radiation, admits to nausea, last BM this morning  Was normal but pt reports Hx of IBS

## 2018-01-01 NOTE — ED Notes (Signed)
Pt completing CHG bath and leaving all belongings with family member at bedside.

## 2018-01-01 NOTE — ED Provider Notes (Signed)
[facility]-EMERGENCY DEPT Provider Note   CSN: [id number] Arrival date & time: 01/01/18  0008     History   Chief Complaint Chief Complaint  Patient presents with  . Abdominal Pain    HPI Laurie Meyer is a 52 y.o. female with a hx of anal fissure, notable bowel syndrome presents to the Emergency Department complaining of gradual, persistent, progressively worsening right lower quadrant abdominal pain onset 7pm.  Pt reports the pain started at generalized and periumbilical and moved to the RLQ about 10pm.  Patient reports she ate [place] food this evening for dinner.  Associated symptoms include nausea without vomiting or diarrhea.  Patient's last bowel movement was this morning and normal.  No melena or hematochezia. Pt reports hx of cholecystectomy, but no other abd surgeries.  Laying still makes it better and walking makes it worse.  Pt rates her pain at a 7/10 laying still.  Pt reports no hx of ovarian cyst.  Denies vaginal discharge.  Pt denies fever, chills, headache, neck pain, chest pain, SOB, vomiting, diarrhea, weakness, dizziness, syncope, urinary symptoms, vaginal symptoms.  LMP: 3 weeks ago,  Pt reports using OCP.     The history is provided by the patient and medical records. No language interpreter was used.    Past Medical History:  Diagnosis Date  . Anal fissure     There are no active problems to display for this patient.   Past Surgical History:  Procedure Laterality Date  . BREAST ENHANCEMENT SURGERY  2001  . CHOLECYSTECTOMY  2005  . NASAL SEPTUM SURGERY  2007     OB History   None      Home Medications    Prior to Admission medications   Medication Sig Start Date End Date Taking? Authorizing Provider  AVIANE 0.1-20 MG-MCG tablet daily. 10/02/11  Yes [provider]  linaclotide (LINZESS) 290 MCG CAPS capsule Take 290 mg by mouth daily. 10/18/17  Yes [provider]  omeprazole (PRILOSEC) 40 MG capsule Take 40  mg by mouth daily.   Yes [provider]  ranitidine (ZANTAC) 150 MG tablet Take 150 mg by mouth at bedtime. 11/06/17  Yes [provider]  hydrocortisone (ANUSOL-HC) 2.5 % rectal cream Place 1 application rectally 2 (two) times daily. Apply around anus for irritated & painful hemorrhoids Patient not taking: Reported on 01/01/2018 06/18/13   Romie Leveehomas, Alicia, MD    Family History Family History  Problem Relation Age of Onset  . Cancer Maternal Uncle        lung    Social History Social History   Tobacco Use  . Smoking status: Never Smoker  . Smokeless tobacco: Never Used  Substance Use Topics  . Alcohol use: Yes    Comment: Special Occasions.  . Drug use: No     Allergies   Patient has no known allergies.   Review of Systems Review of Systems  Constitutional: Negative for appetite change, diaphoresis, fatigue, fever and unexpected weight change.  HENT: Negative for mouth sores.   Eyes: Negative for visual disturbance.  Respiratory: Negative for cough, chest tightness, shortness of breath and wheezing.   Cardiovascular: Negative for chest pain.  Gastrointestinal: Positive for abdominal pain and nausea. Negative for constipation, diarrhea and vomiting.  Endocrine: Negative for polydipsia, polyphagia and polyuria.  Genitourinary: Negative for dysuria, frequency, hematuria and urgency.  Musculoskeletal: Negative for back pain and neck stiffness.  Skin: Negative for rash.  Allergic/Immunologic: Negative for immunocompromised state.  Neurological:  Negative for syncope, light-headedness and headaches.  Hematological: Does not bruise/bleed easily.  Psychiatric/Behavioral: Negative for sleep disturbance. The patient is not nervous/anxious.      Physical Exam Updated Vital Signs BP 128/86 (BP Location: Left Arm)   Pulse 87   Temp 97.8 F (36.6 C) (Oral)   Resp 16   Ht 5\' 8"  (1.727 m)   Wt 68 kg (150 lb)   LMP 12/08/2017   SpO2 100%   BMI 22.81 kg/m    Physical Exam  Constitutional: She appears well-developed and well-nourished. No distress.  Awake, alert, nontoxic appearance  HENT:  Head: Normocephalic and atraumatic.  Mouth/Throat: Oropharynx is clear and moist. No oropharyngeal exudate.  Eyes: Conjunctivae are normal. No scleral icterus.  Neck: Normal range of motion. Neck supple.  Cardiovascular: Normal rate, regular rhythm and intact distal pulses.  Pulmonary/Chest: Effort normal and breath sounds normal. No respiratory distress. She has no wheezes.  Equal chest expansion  Abdominal: Soft. Bowel sounds are normal. She exhibits no mass. There is tenderness in the right lower quadrant. There is rebound and guarding. There is no rigidity and no CVA tenderness.  Musculoskeletal: Normal range of motion. She exhibits no edema.  Neurological: She is alert.  Speech is clear and goal oriented Moves extremities without ataxia  Skin: Skin is warm and dry. She is not diaphoretic.  Psychiatric: She has a normal mood and affect.  Nursing note and vitals reviewed.    ED Treatments / Results  Labs (all labs ordered are listed, but only abnormal results are displayed) Labs Reviewed  CBC WITH DIFFERENTIAL/PLATELET - Abnormal; Notable for the following components:      Result Value   WBC 14.2 (*)    Neutro Abs 12.0 (*)    All other components within normal limits  COMPREHENSIVE METABOLIC PANEL - Abnormal; Notable for the following components:   Glucose, Bld 132 (*)    All other components within normal limits  URINALYSIS, ROUTINE W REFLEX MICROSCOPIC - Abnormal; Notable for the following components:   APPearance HAZY (*)    Leukocytes, UA LARGE (*)    Bacteria, UA RARE (*)    All other components within normal limits  LIPASE, BLOOD  I-STAT BETA HCG BLOOD, ED ([facility], [facility], [facility] ONLY)     Radiology Ct Abdomen Pelvis W Contrast  Result Date: 01/01/2018 CLINICAL DATA:  Acute onset of right lower quadrant abdominal pain and nausea.  Leukocytosis. White blood cells in the urine. EXAM: CT ABDOMEN AND PELVIS WITH CONTRAST TECHNIQUE: Multidetector CT imaging of the abdomen and pelvis was performed using the standard protocol following bolus administration of intravenous contrast. CONTRAST:  ISOVUE-300 IOPAMIDOL (ISOVUE-300) INJECTION 61% COMPARISON:  CT of the abdomen and pelvis from 03/26/2017, and MRCP performed 08/23/2017 FINDINGS: Lower chest: The visualized lung bases are grossly clear. The visualized portions of the mediastinum are unremarkable. The visualized portions of the breast implants are unremarkable. Hepatobiliary: The liver is unremarkable in appearance. The patient is status post cholecystectomy, with clips noted at the gallbladder fossa. The common bile duct remains normal in caliber. Pancreas: The pancreas is within normal limits. Spleen: The spleen is unremarkable in appearance. Adrenals/Urinary Tract: The adrenal glands are unremarkable in appearance. The kidneys are within normal limits. There is no evidence of hydronephrosis. No renal or ureteral stones are identified. No perinephric stranding is seen. Stomach/Bowel: There is dilatation of the appendix to 1.4 cm in diameter. The appendix is retrocecal in nature. Wall thickening is noted, with surrounding soft  tissue inflammation and trace fluid, compatible with acute appendicitis. There is no definite evidence of perforation or abscess formation at this time. The colon is unremarkable in appearance. The small bowel is grossly unremarkable. The stomach is largely decompressed and unremarkable in appearance. Vascular/Lymphatic: The abdominal aorta is unremarkable in appearance. The inferior vena cava is grossly unremarkable. No retroperitoneal lymphadenopathy is seen. No pelvic sidewall lymphadenopathy is identified. Reproductive: The bladder is mildly distended and within normal limits. The uterus is grossly unremarkable in appearance. The ovaries are relatively  symmetric. No suspicious adnexal masses are seen. Other: No additional soft tissue abnormalities are seen. Musculoskeletal: No acute osseous abnormalities are identified. The visualized musculature is unremarkable in appearance. IMPRESSION: Acute appendicitis, with wall thickening and surrounding soft tissue inflammation. The appendix is retrocecal in nature. No definite evidence of perforation or abscess formation at this time. These results were called by telephone at the time of interpretation on 01/01/2018 at 5:13 am to [facility] PA, who verbally acknowledged these results. Electronically Signed   By: Roanna Raider M.D.   On: 01/01/2018 05:15    Procedures Procedures (including critical care time)  Medications Ordered in ED Medications  iopamidol (ISOVUE-300) 61 % injection (has no administration in time range)  cefTRIAXone (ROCEPHIN) 2 g in sodium chloride 0.9 % 100 mL IVPB (has no administration in time range)    And  metroNIDAZOLE (FLAGYL) IVPB 500 mg (has no administration in time range)  morphine 4 MG/ML injection 4 mg (4 mg Intravenous Given 01/01/18 0255)  ondansetron (ZOFRAN) injection 4 mg (4 mg Intravenous Given 01/01/18 0255)  iopamidol (ISOVUE-300) 61 % injection 100 mL (100 mLs Intravenous Contrast Given 01/01/18 0447)     Initial Impression / Assessment and Plan / ED Course  I have reviewed the triage vital signs and the nursing notes.  Pertinent labs & imaging results that were available during my care of the patient were reviewed by me and considered in my medical decision making (see chart for details).  Clinical Course as of Jan 02 531  Wed Jan 01, 2018  0227 Last oral intake 7pm   [HM]  0336 Leukocytosis noted  WBC(!): 14.2 [HM]  0531 Discussed with Dr. Johna Sheriff who will evaluate and admit at 7am.     [HM]    Clinical Course User Index [HM] Kalonji Zurawski, Dahlia Client, PA-C    Patient presents to the emergency department with severe right lower quadrant  abdominal pain.  On exam she has rebound and guarding.  Clinically concern for acute appendicitis.  Labs show leukocytosis but no other abnormalities.  5:33 AM CT scan affirms acute appendicitis.  I personally evaluated these images.  Antibiotics ordered.  Patient was discussed with general surgery who will evaluate this morning and admit.  Patient remains afebrile without tachycardia or hypotension.  No evidence of sepsis.  Pain is well controlled at this time.  Patient has been n.p.o. since her last oral intake at 7 PM.  The patient was discussed with and seen by Dr. Eudelia Bunch who agrees with the treatment plan.   Final Clinical Impressions(s) / ED Diagnoses   Final diagnoses:  Acute appendicitis, unspecified acute appendicitis type    ED Discharge Orders    None       Milta Deiters 01/01/18 0534    Nira Conn, MD 01/01/18 [id number]

## 2018-01-01 NOTE — Discharge Summary (Signed)
[facility] Discharge Summary   Patient ID: Laurie Meyer MRN: [id number] DOB/AGE: [birth year] 52 y.o.  Admit date: 01/01/2018 Discharge date: 01/01/2018  Admitting Diagnosis: Acute appendicitis  Discharge Diagnosis Patient Active Problem List   Diagnosis Date Noted  . Acute appendicitis, uncomplicated 01/01/2018    Consultants None  Imaging: Ct Abdomen Pelvis W Contrast  Result Date: 01/01/2018 CLINICAL DATA:  Acute onset of right lower quadrant abdominal pain and nausea. Leukocytosis. White blood cells in the urine. EXAM: CT ABDOMEN AND PELVIS WITH CONTRAST TECHNIQUE: Multidetector CT imaging of the abdomen and pelvis was performed using the standard protocol following bolus administration of intravenous contrast. CONTRAST:  ISOVUE-300 IOPAMIDOL (ISOVUE-300) INJECTION 61% COMPARISON:  CT of the abdomen and pelvis from 03/26/2017, and MRCP performed 08/23/2017 FINDINGS: Lower chest: The visualized lung bases are grossly clear. The visualized portions of the mediastinum are unremarkable. The visualized portions of the breast implants are unremarkable. Hepatobiliary: The liver is unremarkable in appearance. The patient is status post cholecystectomy, with clips noted at the gallbladder fossa. The common bile duct remains normal in caliber. Pancreas: The pancreas is within normal limits. Spleen: The spleen is unremarkable in appearance. Adrenals/Urinary Tract: The adrenal glands are unremarkable in appearance. The kidneys are within normal limits. There is no evidence of hydronephrosis. No renal or ureteral stones are identified. No perinephric stranding is seen. Stomach/Bowel: There is dilatation of the appendix to 1.4 cm in diameter. The appendix is retrocecal in nature. Wall thickening is noted, with surrounding soft tissue inflammation and trace fluid, compatible with acute appendicitis. There is no definite evidence of perforation or abscess formation at this time. The  colon is unremarkable in appearance. The small bowel is grossly unremarkable. The stomach is largely decompressed and unremarkable in appearance. Vascular/Lymphatic: The abdominal aorta is unremarkable in appearance. The inferior vena cava is grossly unremarkable. No retroperitoneal lymphadenopathy is seen. No pelvic sidewall lymphadenopathy is identified. Reproductive: The bladder is mildly distended and within normal limits. The uterus is grossly unremarkable in appearance. The ovaries are relatively symmetric. No suspicious adnexal masses are seen. Other: No additional soft tissue abnormalities are seen. Musculoskeletal: No acute osseous abnormalities are identified. The visualized musculature is unremarkable in appearance. IMPRESSION: Acute appendicitis, with wall thickening and surrounding soft tissue inflammation. The appendix is retrocecal in nature. No definite evidence of perforation or abscess formation at this time. These results were called by telephone at the time of interpretation on 01/01/2018 at 5:13 am to [facility] PA, who verbally acknowledged these results. Electronically Signed   By: Roanna Raider M.D.   On: 01/01/2018 05:15    Procedures Dr. Dwain Sarna (01/01/18) - Laparoscopic Appendectomy  Hospital Course:  Laurie Meyer is a 52yo female who presented to [facility] 6/[facility] with acute onset generalized abdominal pain that localized to RLQ.  Workup included a CT scan which showed acute appendicitis.  Patient was admitted and underwent procedure listed above.  Tolerated procedure well and was transferred to the floor.  Diet was advanced as tolerated.  On POD0 the patient was voiding well, tolerating diet, ambulating well, pain well controlled, vital signs stable, incisions c/d/i and felt stable for discharge home.  Patient will follow up as below and knows to call with questions or concerns.    I have personally reviewed the patients medication history on the [place] controlled substance  database.    Physical Exam: Gen: alert, NAD Pulm: effort normal Abd: soft, ND, appropriately tender, +BS, lap incisions cdi with steri  strips in place Skin: warm and dry, no rashes  Allergies as of 01/01/2018   No Known Allergies     Medication List    STOP taking these medications   hydrocortisone 2.5 % rectal cream Commonly known as:  ANUSOL-HC     TAKE these medications   acetaminophen 500 MG tablet Commonly known as:  TYLENOL Take 2 tablets (1,000 mg total) by mouth every 8 (eight) hours.   AVIANE 0.1-20 MG-MCG tablet Generic drug:  levonorgestrel-ethinyl estradiol daily.   ibuprofen 400 MG tablet Commonly known as:  ADVIL,MOTRIN Take 1 tablet (400 mg total) by mouth every 8 (eight) hours.   LINZESS 290 MCG Caps capsule Generic drug:  linaclotide Take 290 mg by mouth daily.   omeprazole 40 MG capsule Commonly known as:  PRILOSEC Take 40 mg by mouth daily.   oxyCODONE 5 MG immediate release tablet Commonly known as:  Oxy IR/ROXICODONE Take 1 tablet (5 mg total) by mouth every 6 (six) hours as needed for severe pain.   ranitidine 150 MG tablet Commonly known as:  ZANTAC Take 150 mg by mouth at bedtime.        Follow-up Information    [facility] [place] Go on 01/14/2018.   Specialty:  General Surgery Why:  Your appointment is 06/18 at 11:15 am Please arrive 30 minutes prior to your appointment to check in and fill out paperwork. Bring photo ID and insurance information. Contact information: [address] [place] [id number] [phone]          Signed: Franne FortsBrooke A Plummer Matich, [facility] 01/01/2018, 12:02 PM Pager: [phone] Consults: [phone] Mon 7:00 am -11:30 AM Tues-Fri 7:00 am-4:30 pm Sat-Sun 7:00 am-11:30 am

## 2018-01-01 NOTE — Anesthesia Procedure Notes (Signed)
Procedure Name: Intubation Date/Time: 01/01/2018 9:04 AM Performed by: Tess Potts D, CRNA Pre-anesthesia Checklist: Patient identified, Emergency Drugs available, Suction available and Patient being monitored Patient Re-evaluated:Patient Re-evaluated prior to induction Oxygen Delivery Method: Circle system utilized Preoxygenation: Pre-oxygenation with 100% oxygen Induction Type: IV induction, Rapid sequence and Cricoid Pressure applied Ventilation: Mask ventilation without difficulty Laryngoscope Size: Miller and 2 Grade View: Grade III Tube type: Oral Number of attempts: 3 Airway Equipment and Method: Stylet and Bougie stylet Placement Confirmation: ETT inserted through vocal cords under direct vision,  positive ETCO2 and breath sounds checked- equal and bilateral Tube secured with: Tape Dental Injury: Teeth and Oropharynx as per pre-operative assessment

## 2018-01-01 NOTE — Anesthesia Postprocedure Evaluation (Signed)
Anesthesia Post Note  Patient: Laurie Meyer  Procedure(s) Performed: APPENDECTOMY LAPAROSCOPIC (N/A Abdomen)     Patient location during evaluation: PACU Anesthesia Type: General Level of consciousness: awake and alert Pain management: pain level controlled Vital Signs Assessment: post-procedure vital signs reviewed and stable Respiratory status: spontaneous breathing, nonlabored ventilation, respiratory function stable and patient connected to nasal cannula oxygen Cardiovascular status: blood pressure returned to baseline and stable Postop Assessment: no apparent nausea or vomiting Anesthetic complications: no    Last Vitals:  Vitals:   01/01/18 1100 01/01/18 1118  BP: 116/79 126/71  Pulse: 71 72  Resp: 14 15  Temp: 36.8 C 36.8 C  SpO2: 100% 100%    Last Pain:  Vitals:   01/01/18 1128  TempSrc:   PainSc: 0-No pain                 Trevor IhaStephen A Ura Hausen

## 2018-01-01 NOTE — Discharge Instructions (Signed)

## 2018-01-01 NOTE — Transfer of Care (Signed)
Immediate Anesthesia Transfer of Care Note  Patient: Laurie Meyer  Procedure(s) Performed: APPENDECTOMY LAPAROSCOPIC (N/A Abdomen)  Patient Location: PACU  Anesthesia Type:General  Level of Consciousness: awake, alert  and oriented  Airway & Oxygen Therapy: Patient Spontanous Breathing and Patient connected to face mask oxygen  Post-op Assessment: Report given to RN and Post -op Vital signs reviewed and stable  Post vital signs: Reviewed and stable  Last Vitals:  Vitals Value Taken Time  BP 116/88 01/01/2018 10:08 AM  Temp    Pulse 83 01/01/2018 10:13 AM  Resp 14 01/01/2018 10:13 AM  SpO2 100 % 01/01/2018 10:13 AM  Vitals shown include unvalidated device data.  Last Pain:  Vitals:   01/01/18 0317  TempSrc:   PainSc: 2       Patients Stated Pain Goal: 4 (01/01/18 0117)  Complications: No apparent anesthesia complications

## 2018-01-01 NOTE — H&P (Signed)
Laurie Meyer is an 52 y.o. female.   Chief Complaint: rlq pain HPI: 18 yof known to our office for prior anal fissure. She has onset of generalized abdominal pain after dinner last night that is now localized to rlq.  No fever. Has nausea, no emesis. History of conspitation. Has recent colonoscopy by Dr Cristina Gong she says was normal.  Has prior lap chole over 10 years ago.  She had ct scan and appears she has appendicitis.   Past Medical History:  Diagnosis Date  . Anal fissure     Past Surgical History:  Procedure Laterality Date  . BREAST ENHANCEMENT SURGERY  2001  . CHOLECYSTECTOMY  2005  . NASAL SEPTUM SURGERY  2007    Family History  Problem Relation Age of Onset  . Cancer Maternal Uncle        lung   Social History:  reports that she has never smoked. She has never used smokeless tobacco. She reports that she drinks alcohol. She reports that she does not use drugs.  Allergies: No Known Allergies  meds reviewed  Results for orders placed or performed during the hospital encounter of 01/01/18 (from the past 48 hour(s))  CBC with Differential     Status: Abnormal   Collection Time: 01/01/18  2:41 AM  Result Value Ref Range   WBC 14.2 (H) 4.0 - 10.5 K/uL   RBC 4.01 3.87 - 5.11 MIL/uL   Hemoglobin 12.3 12.0 - 15.0 g/dL   HCT 37.2 36.0 - 46.0 %   MCV 92.8 78.0 - 100.0 fL   MCH 30.7 26.0 - 34.0 pg   MCHC 33.1 30.0 - 36.0 g/dL   RDW 12.6 11.5 - 15.5 %   Platelets 256 150 - 400 K/uL   Neutrophils Relative % 84 %   Neutro Abs 12.0 (H) 1.7 - 7.7 K/uL   Lymphocytes Relative 11 %   Lymphs Abs 1.5 0.7 - 4.0 K/uL   Monocytes Relative 4 %   Monocytes Absolute 0.5 0.1 - 1.0 K/uL   Eosinophils Relative 1 %   Eosinophils Absolute 0.1 0.0 - 0.7 K/uL   Basophils Relative 0 %   Basophils Absolute 0.0 0.0 - 0.1 K/uL    Comment: Performed at [facility], [place] [address] [place], [place] [address]  Comprehensive metabolic panel     Status: Abnormal   Collection  Time: 01/01/18  2:41 AM  Result Value Ref Range   Sodium 139 135 - 145 mmol/L   Potassium 4.0 3.5 - 5.1 mmol/L   Chloride 106 101 - 111 mmol/L   CO2 27 22 - 32 mmol/L   Glucose, Bld 132 (H) 65 - 99 mg/dL   BUN 13 6 - 20 mg/dL   Creatinine, Ser 0.86 0.44 - 1.00 mg/dL   Calcium 8.9 8.9 - 10.3 mg/dL   Total Protein 7.7 6.5 - 8.1 g/dL   Albumin 4.0 3.5 - 5.0 g/dL   AST 20 15 - 41 U/L   ALT 17 14 - 54 U/L   Alkaline Phosphatase 64 38 - 126 U/L   Total Bilirubin 0.4 0.3 - 1.2 mg/dL   GFR calc non Af Amer >60 >60 mL/min   GFR calc Af Amer >60 >60 mL/min    Comment: (NOTE) The eGFR has been calculated using the CKD EPI equation. This calculation has not been validated in all clinical situations. eGFR's persistently <60 mL/min signify possible Chronic Kidney Disease.    Anion gap 6 5 - 15  Comment: Performed at [facility], [place] [address], [place], [place] [address]  Lipase, blood     Status: None   Collection Time: 01/01/18  2:41 AM  Result Value Ref Range   Lipase 29 11 - 51 U/L    Comment: Performed at [facility], [place] [address], [place], [place] [address]  I-Stat Beta hCG blood, ED ([facility], [facility], [facility] only)     Status: None   Collection Time: 01/01/18  2:52 AM  Result Value Ref Range   I-stat hCG, quantitative <5.0 <5 mIU/mL   Comment 3            Comment:   GEST. AGE      CONC.  (mIU/mL)   <=1 WEEK        5 - 50     2 WEEKS       50 - 500     3 WEEKS       100 - 10,000     4 WEEKS     1,000 - 30,000        FEMALE AND NON-PREGNANT FEMALE:     LESS THAN 5 mIU/mL   Urinalysis, Routine w reflex microscopic     Status: Abnormal   Collection Time: 01/01/18  3:37 AM  Result Value Ref Range   Color, Urine YELLOW YELLOW   APPearance HAZY (A) CLEAR   Specific Gravity, Urine 1.023 1.005 - 1.030   pH 6.0 5.0 - 8.0   Glucose, UA NEGATIVE NEGATIVE mg/dL   Hgb urine dipstick NEGATIVE NEGATIVE   Bilirubin Urine NEGATIVE NEGATIVE   Ketones, ur  NEGATIVE NEGATIVE mg/dL   Protein, ur NEGATIVE NEGATIVE mg/dL   Nitrite NEGATIVE NEGATIVE   Leukocytes, UA LARGE (A) NEGATIVE   RBC / HPF 0-5 0 - 5 RBC/hpf   WBC, UA 11-20 0 - 5 WBC/hpf   Bacteria, UA RARE (A) NONE SEEN   Squamous Epithelial / LPF 0-5 0 - 5   Mucus PRESENT     Comment: Performed at [facility], [place] [address], [place], [place] [address]   Ct Abdomen Pelvis W Contrast  Result Date: 01/01/2018 CLINICAL DATA:  Acute onset of right lower quadrant abdominal pain and nausea. Leukocytosis. White blood cells in the urine. EXAM: CT ABDOMEN AND PELVIS WITH CONTRAST TECHNIQUE: Multidetector CT imaging of the abdomen and pelvis was performed using the standard protocol following bolus administration of intravenous contrast. CONTRAST:  146m ISOVUE-300 IOPAMIDOL (ISOVUE-300) INJECTION 61% COMPARISON:  CT of the abdomen and pelvis from 03/26/2017, and MRCP performed 08/23/2017 FINDINGS: Lower chest: The visualized lung bases are grossly clear. The visualized portions of the mediastinum are unremarkable. The visualized portions of the breast implants are unremarkable. Hepatobiliary: The liver is unremarkable in appearance. The patient is status post cholecystectomy, with clips noted at the gallbladder fossa. The common bile duct remains normal in caliber. Pancreas: The pancreas is within normal limits. Spleen: The spleen is unremarkable in appearance. Adrenals/Urinary Tract: The adrenal glands are unremarkable in appearance. The kidneys are within normal limits. There is no evidence of hydronephrosis. No renal or ureteral stones are identified. No perinephric stranding is seen. Stomach/Bowel: There is dilatation of the appendix to 1.4 cm in diameter. The appendix is retrocecal in nature. Wall thickening is noted, with surrounding soft tissue inflammation and trace fluid, compatible with acute appendicitis. There is no definite evidence of perforation or abscess formation at this  time. The colon is unremarkable in appearance. The  small bowel is grossly unremarkable. The stomach is largely decompressed and unremarkable in appearance. Vascular/Lymphatic: The abdominal aorta is unremarkable in appearance. The inferior vena cava is grossly unremarkable. No retroperitoneal lymphadenopathy is seen. No pelvic sidewall lymphadenopathy is identified. Reproductive: The bladder is mildly distended and within normal limits. The uterus is grossly unremarkable in appearance. The ovaries are relatively symmetric. No suspicious adnexal masses are seen. Other: No additional soft tissue abnormalities are seen. Musculoskeletal: No acute osseous abnormalities are identified. The visualized musculature is unremarkable in appearance. IMPRESSION: Acute appendicitis, with wall thickening and surrounding soft tissue inflammation. The appendix is retrocecal in nature. No definite evidence of perforation or abscess formation at this time. These results were called by telephone at the time of interpretation on 01/01/2018 at 5:13 am to [facility] PA, who verbally acknowledged these results. Electronically Signed   By: Garald Balding M.D.   On: 01/01/2018 05:15    Review of Systems  Gastrointestinal: Positive for abdominal pain, constipation and nausea.  All other systems reviewed and are negative.   Blood pressure 121/77, pulse 77, temperature 97.8 F (36.6 C), temperature source Oral, resp. rate 19, height '5\' 8"'$  (1.727 m), weight 68 kg (150 lb), last menstrual period 12/08/2017, SpO2 99 %. Physical Exam  Vitals reviewed. Constitutional: She is oriented to person, place, and time. She appears well-developed and well-nourished.  HENT:  Head: Normocephalic and atraumatic.  Eyes: No scleral icterus.  Neck: Neck supple.  Cardiovascular: Normal rate.  Respiratory: Effort normal.  GI: Soft. Bowel sounds are normal. She exhibits no distension. There is tenderness in the right lower quadrant. There is  tenderness at McBurney's point.  Healed lap chole scars   Neurological: She is alert and oriented to person, place, and time.  Skin: Skin is warm and dry.     Assessment/Plan Acute appendicitis  Discussed options of abx vs surgery. I recommended lap appy. Discussed surgery, risks and recovery. Will proceed today  Rolm Bookbinder, MD 01/01/2018, 7:59 AM

## 2018-01-01 NOTE — ED Notes (Signed)
Pt aware urine sample is needed 

## 2018-01-01 NOTE — Op Note (Signed)
Preoperative diagnosis: acute appendicitis Postoperative diagnosis: same as above Procedure: Laparoscopic appendectomy Surgeon: Dr Emelia LoronMatthew Cherrise Occhipinti Asst: Bailey MechLiz Simaan, PA-C Anesthesia: general EBL: minimal Specimens: appendix to pathology Complications none  Drains none Sponge and needle count correct dispo to recovery stable  Indications: This is a 52 year old female who presents with right lower quadrant pain, elevated white blood cell count, and a CT scan with acute appendicitis.  We discussed options and decided proceed with laparoscopic appendectomy.  Procedure: After informed consent was obtained the patient was taken to the operating room.  She was already given antibiotics.  SCDs were placed.  She was placed under general anesthesia without complication.  A Foley catheter and orogastric tube were placed.  She was prepped and draped in the standard sterile surgical fashion.  Surgical timeout was then performed.  I infiltrated Marcaine at her old curvilinear incision below her umbilicus from her cholecystectomy.  I then reentered this incision.  I identified the fascia.  I incised sharply and entered the peritoneum bluntly.  I placed a 0 Vicryl pursestring suture.  I placed a Hassan trocar and insufflated the abdomen to 15 mmHg pressure.  I placed 2 additional 5 mm trochars in the suprapubic region and left lower quadrant without injury.  I then identified the cecum.  She had acute nonperforated appendicitis.  I was able to divide the appendiceal mesentery with the harmonic scalpel.  I then used a GIA stapler to divide the appendix at the base of the cecum.  This was then placed in a retrieval bag and removed.  Hemostasis was observed.  I then remove the [facility] and tied by pursestring down.  I placed an additional 0 Vicryl suture using a Endo Close device.  I then remove the remaining trochars and desufflated the abdomen.  I closed these were closed with 4-0 Monocryl and Dermabond.  She  tolerated this well was extubated and transferred to recovery stable.

## 2018-01-01 NOTE — Anesthesia Preprocedure Evaluation (Addendum)
Anesthesia Evaluation  Patient identified by MRN, date of birth, ID band Patient awake    Reviewed: Allergy & Precautions, NPO status , Patient's Chart, lab work & pertinent test results  Airway Mallampati: I  TM Distance: <3 FB Neck ROM: Full   Comment: Pt w overbite Dental no notable dental hx. (+) Teeth Intact, Dental Advisory Given   Pulmonary neg pulmonary ROS,    Pulmonary exam normal breath sounds clear to auscultation       Cardiovascular Exercise Tolerance: Good negative cardio ROS Normal cardiovascular exam Rhythm:Regular Rate:Normal     Neuro/Psych negative neurological ROS     GI/Hepatic negative GI ROS, Neg liver ROS,   Endo/Other    Renal/GU negative Renal ROS     Musculoskeletal   Abdominal   Peds  Hematology negative hematology ROS (+)   Anesthesia Other Findings   Reproductive/Obstetrics                            Lab Results  Component Value Date   WBC 14.2 (H) 01/01/2018   HGB 12.3 01/01/2018   HCT 37.2 01/01/2018   MCV 92.8 01/01/2018   PLT 256 01/01/2018     Anesthesia Physical Anesthesia Plan  ASA: I and emergent  Anesthesia Plan: General   Post-op Pain Management:    Induction: Intravenous  PONV Risk Score and Plan: 4 or greater and Treatment may vary due to age or medical condition, Ondansetron, Dexamethasone and Scopolamine patch - Pre-op  Airway Management Planned: Oral ETT  Additional Equipment:   Intra-op Plan:   Post-operative Plan: Extubation in OR  Informed Consent: I have reviewed the patients History and Physical, chart, labs and discussed the procedure including the risks, benefits and alternatives for the proposed anesthesia with the patient or authorized representative who has indicated his/her understanding and acceptance.   Dental advisory given  Plan Discussed with: CRNA  Anesthesia Plan Comments:         Anesthesia  Quick Evaluation

## 2018-01-02 ENCOUNTER — Encounter (HOSPITAL_COMMUNITY): Payer: Self-pay | Admitting: General Surgery

## 2018-01-02 LAB — HIV ANTIBODY (ROUTINE TESTING W REFLEX): HIV Screen 4th Generation wRfx: NONREACTIVE

## 2018-01-03 DIAGNOSIS — J301 Allergic rhinitis due to pollen: Secondary | ICD-10-CM | POA: Diagnosis not present

## 2018-01-03 DIAGNOSIS — J3089 Other allergic rhinitis: Secondary | ICD-10-CM | POA: Diagnosis not present

## 2018-01-03 DIAGNOSIS — J3081 Allergic rhinitis due to animal (cat) (dog) hair and dander: Secondary | ICD-10-CM | POA: Diagnosis not present

## 2018-01-09 DIAGNOSIS — J301 Allergic rhinitis due to pollen: Secondary | ICD-10-CM | POA: Diagnosis not present

## 2018-01-09 DIAGNOSIS — J3089 Other allergic rhinitis: Secondary | ICD-10-CM | POA: Diagnosis not present

## 2018-01-09 DIAGNOSIS — J3081 Allergic rhinitis due to animal (cat) (dog) hair and dander: Secondary | ICD-10-CM | POA: Diagnosis not present

## 2018-01-14 DIAGNOSIS — J3081 Allergic rhinitis due to animal (cat) (dog) hair and dander: Secondary | ICD-10-CM | POA: Diagnosis not present

## 2018-01-14 DIAGNOSIS — J301 Allergic rhinitis due to pollen: Secondary | ICD-10-CM | POA: Diagnosis not present

## 2018-01-14 DIAGNOSIS — J3089 Other allergic rhinitis: Secondary | ICD-10-CM | POA: Diagnosis not present

## 2018-01-22 DIAGNOSIS — J3089 Other allergic rhinitis: Secondary | ICD-10-CM | POA: Diagnosis not present

## 2018-01-22 DIAGNOSIS — J3081 Allergic rhinitis due to animal (cat) (dog) hair and dander: Secondary | ICD-10-CM | POA: Diagnosis not present

## 2018-01-22 DIAGNOSIS — J301 Allergic rhinitis due to pollen: Secondary | ICD-10-CM | POA: Diagnosis not present

## 2018-01-28 DIAGNOSIS — J3081 Allergic rhinitis due to animal (cat) (dog) hair and dander: Secondary | ICD-10-CM | POA: Diagnosis not present

## 2018-01-28 DIAGNOSIS — J3089 Other allergic rhinitis: Secondary | ICD-10-CM | POA: Diagnosis not present

## 2018-01-28 DIAGNOSIS — J301 Allergic rhinitis due to pollen: Secondary | ICD-10-CM | POA: Diagnosis not present

## 2018-02-04 DIAGNOSIS — J3081 Allergic rhinitis due to animal (cat) (dog) hair and dander: Secondary | ICD-10-CM | POA: Diagnosis not present

## 2018-02-04 DIAGNOSIS — J3089 Other allergic rhinitis: Secondary | ICD-10-CM | POA: Diagnosis not present

## 2018-02-04 DIAGNOSIS — J301 Allergic rhinitis due to pollen: Secondary | ICD-10-CM | POA: Diagnosis not present

## 2018-02-11 DIAGNOSIS — J3089 Other allergic rhinitis: Secondary | ICD-10-CM | POA: Diagnosis not present

## 2018-02-11 DIAGNOSIS — J3081 Allergic rhinitis due to animal (cat) (dog) hair and dander: Secondary | ICD-10-CM | POA: Diagnosis not present

## 2018-02-11 DIAGNOSIS — J301 Allergic rhinitis due to pollen: Secondary | ICD-10-CM | POA: Diagnosis not present

## 2018-02-18 DIAGNOSIS — J301 Allergic rhinitis due to pollen: Secondary | ICD-10-CM | POA: Diagnosis not present

## 2018-02-18 DIAGNOSIS — J3089 Other allergic rhinitis: Secondary | ICD-10-CM | POA: Diagnosis not present

## 2018-02-18 DIAGNOSIS — J3081 Allergic rhinitis due to animal (cat) (dog) hair and dander: Secondary | ICD-10-CM | POA: Diagnosis not present

## 2018-02-25 DIAGNOSIS — J3081 Allergic rhinitis due to animal (cat) (dog) hair and dander: Secondary | ICD-10-CM | POA: Diagnosis not present

## 2018-02-25 DIAGNOSIS — J301 Allergic rhinitis due to pollen: Secondary | ICD-10-CM | POA: Diagnosis not present

## 2018-02-25 DIAGNOSIS — J3089 Other allergic rhinitis: Secondary | ICD-10-CM | POA: Diagnosis not present

## 2018-03-05 DIAGNOSIS — J3089 Other allergic rhinitis: Secondary | ICD-10-CM | POA: Diagnosis not present

## 2018-03-05 DIAGNOSIS — J3081 Allergic rhinitis due to animal (cat) (dog) hair and dander: Secondary | ICD-10-CM | POA: Diagnosis not present

## 2018-03-05 DIAGNOSIS — J301 Allergic rhinitis due to pollen: Secondary | ICD-10-CM | POA: Diagnosis not present

## 2018-03-12 DIAGNOSIS — J3089 Other allergic rhinitis: Secondary | ICD-10-CM | POA: Diagnosis not present

## 2018-03-12 DIAGNOSIS — J3081 Allergic rhinitis due to animal (cat) (dog) hair and dander: Secondary | ICD-10-CM | POA: Diagnosis not present

## 2018-03-12 DIAGNOSIS — J301 Allergic rhinitis due to pollen: Secondary | ICD-10-CM | POA: Diagnosis not present

## 2018-03-13 DIAGNOSIS — J3081 Allergic rhinitis due to animal (cat) (dog) hair and dander: Secondary | ICD-10-CM | POA: Diagnosis not present

## 2018-03-13 DIAGNOSIS — J301 Allergic rhinitis due to pollen: Secondary | ICD-10-CM | POA: Diagnosis not present

## 2018-03-14 DIAGNOSIS — J3089 Other allergic rhinitis: Secondary | ICD-10-CM | POA: Diagnosis not present

## 2018-03-18 DIAGNOSIS — J3081 Allergic rhinitis due to animal (cat) (dog) hair and dander: Secondary | ICD-10-CM | POA: Diagnosis not present

## 2018-03-18 DIAGNOSIS — J301 Allergic rhinitis due to pollen: Secondary | ICD-10-CM | POA: Diagnosis not present

## 2018-03-18 DIAGNOSIS — K219 Gastro-esophageal reflux disease without esophagitis: Secondary | ICD-10-CM | POA: Diagnosis not present

## 2018-03-18 DIAGNOSIS — J3089 Other allergic rhinitis: Secondary | ICD-10-CM | POA: Diagnosis not present

## 2018-03-25 DIAGNOSIS — J3081 Allergic rhinitis due to animal (cat) (dog) hair and dander: Secondary | ICD-10-CM | POA: Diagnosis not present

## 2018-03-25 DIAGNOSIS — J301 Allergic rhinitis due to pollen: Secondary | ICD-10-CM | POA: Diagnosis not present

## 2018-03-25 DIAGNOSIS — J3089 Other allergic rhinitis: Secondary | ICD-10-CM | POA: Diagnosis not present

## 2018-04-02 DIAGNOSIS — J301 Allergic rhinitis due to pollen: Secondary | ICD-10-CM | POA: Diagnosis not present

## 2018-04-02 DIAGNOSIS — J3089 Other allergic rhinitis: Secondary | ICD-10-CM | POA: Diagnosis not present

## 2018-04-02 DIAGNOSIS — J3081 Allergic rhinitis due to animal (cat) (dog) hair and dander: Secondary | ICD-10-CM | POA: Diagnosis not present

## 2018-04-05 DIAGNOSIS — Z23 Encounter for immunization: Secondary | ICD-10-CM | POA: Diagnosis not present

## 2018-04-08 DIAGNOSIS — J3089 Other allergic rhinitis: Secondary | ICD-10-CM | POA: Diagnosis not present

## 2018-04-08 DIAGNOSIS — J3081 Allergic rhinitis due to animal (cat) (dog) hair and dander: Secondary | ICD-10-CM | POA: Diagnosis not present

## 2018-04-08 DIAGNOSIS — J301 Allergic rhinitis due to pollen: Secondary | ICD-10-CM | POA: Diagnosis not present

## 2018-04-15 DIAGNOSIS — J301 Allergic rhinitis due to pollen: Secondary | ICD-10-CM | POA: Diagnosis not present

## 2018-04-15 DIAGNOSIS — J3081 Allergic rhinitis due to animal (cat) (dog) hair and dander: Secondary | ICD-10-CM | POA: Diagnosis not present

## 2018-04-15 DIAGNOSIS — J3089 Other allergic rhinitis: Secondary | ICD-10-CM | POA: Diagnosis not present

## 2018-04-17 DIAGNOSIS — J301 Allergic rhinitis due to pollen: Secondary | ICD-10-CM | POA: Diagnosis not present

## 2018-04-17 DIAGNOSIS — J3089 Other allergic rhinitis: Secondary | ICD-10-CM | POA: Diagnosis not present

## 2018-04-17 DIAGNOSIS — J3081 Allergic rhinitis due to animal (cat) (dog) hair and dander: Secondary | ICD-10-CM | POA: Diagnosis not present

## 2018-04-22 DIAGNOSIS — J301 Allergic rhinitis due to pollen: Secondary | ICD-10-CM | POA: Diagnosis not present

## 2018-04-22 DIAGNOSIS — J3089 Other allergic rhinitis: Secondary | ICD-10-CM | POA: Diagnosis not present

## 2018-04-22 DIAGNOSIS — J3081 Allergic rhinitis due to animal (cat) (dog) hair and dander: Secondary | ICD-10-CM | POA: Diagnosis not present

## 2018-04-23 ENCOUNTER — Other Ambulatory Visit: Payer: Self-pay | Admitting: Family Medicine

## 2018-04-23 DIAGNOSIS — Z1231 Encounter for screening mammogram for malignant neoplasm of breast: Secondary | ICD-10-CM

## 2018-04-24 DIAGNOSIS — J301 Allergic rhinitis due to pollen: Secondary | ICD-10-CM | POA: Diagnosis not present

## 2018-04-24 DIAGNOSIS — J3081 Allergic rhinitis due to animal (cat) (dog) hair and dander: Secondary | ICD-10-CM | POA: Diagnosis not present

## 2018-04-24 DIAGNOSIS — J3089 Other allergic rhinitis: Secondary | ICD-10-CM | POA: Diagnosis not present

## 2018-04-30 DIAGNOSIS — J3081 Allergic rhinitis due to animal (cat) (dog) hair and dander: Secondary | ICD-10-CM | POA: Diagnosis not present

## 2018-04-30 DIAGNOSIS — J3089 Other allergic rhinitis: Secondary | ICD-10-CM | POA: Diagnosis not present

## 2018-04-30 DIAGNOSIS — J301 Allergic rhinitis due to pollen: Secondary | ICD-10-CM | POA: Diagnosis not present

## 2018-05-02 DIAGNOSIS — T8543XA Leakage of breast prosthesis and implant, initial encounter: Secondary | ICD-10-CM | POA: Diagnosis not present

## 2018-05-07 ENCOUNTER — Other Ambulatory Visit: Payer: Self-pay | Admitting: Family Medicine

## 2018-05-07 DIAGNOSIS — T8543XA Leakage of breast prosthesis and implant, initial encounter: Secondary | ICD-10-CM

## 2018-05-07 DIAGNOSIS — J3089 Other allergic rhinitis: Secondary | ICD-10-CM | POA: Diagnosis not present

## 2018-05-07 DIAGNOSIS — J3081 Allergic rhinitis due to animal (cat) (dog) hair and dander: Secondary | ICD-10-CM | POA: Diagnosis not present

## 2018-05-07 DIAGNOSIS — J301 Allergic rhinitis due to pollen: Secondary | ICD-10-CM | POA: Diagnosis not present

## 2018-05-13 DIAGNOSIS — J3089 Other allergic rhinitis: Secondary | ICD-10-CM | POA: Diagnosis not present

## 2018-05-13 DIAGNOSIS — J301 Allergic rhinitis due to pollen: Secondary | ICD-10-CM | POA: Diagnosis not present

## 2018-05-13 DIAGNOSIS — J3081 Allergic rhinitis due to animal (cat) (dog) hair and dander: Secondary | ICD-10-CM | POA: Diagnosis not present

## 2018-05-19 ENCOUNTER — Ambulatory Visit
Admission: RE | Admit: 2018-05-19 | Discharge: 2018-05-19 | Disposition: A | Discharge status: Home / Self Care | Payer: 59 | Source: Ambulatory Visit | Attending: Family Medicine | Admitting: Family Medicine

## 2018-05-19 ENCOUNTER — Ambulatory Visit: Admission: RE | Admit: 2018-05-19 | Payer: 59 | Source: Ambulatory Visit

## 2018-05-19 DIAGNOSIS — T8543XA Leakage of breast prosthesis and implant, initial encounter: Secondary | ICD-10-CM

## 2018-05-19 DIAGNOSIS — R922 Inconclusive mammogram: Secondary | ICD-10-CM | POA: Diagnosis not present

## 2018-05-20 DIAGNOSIS — J3081 Allergic rhinitis due to animal (cat) (dog) hair and dander: Secondary | ICD-10-CM | POA: Diagnosis not present

## 2018-05-20 DIAGNOSIS — J301 Allergic rhinitis due to pollen: Secondary | ICD-10-CM | POA: Diagnosis not present

## 2018-05-20 DIAGNOSIS — J3089 Other allergic rhinitis: Secondary | ICD-10-CM | POA: Diagnosis not present

## 2018-05-27 DIAGNOSIS — J301 Allergic rhinitis due to pollen: Secondary | ICD-10-CM | POA: Diagnosis not present

## 2018-05-27 DIAGNOSIS — J3081 Allergic rhinitis due to animal (cat) (dog) hair and dander: Secondary | ICD-10-CM | POA: Diagnosis not present

## 2018-05-27 DIAGNOSIS — J3089 Other allergic rhinitis: Secondary | ICD-10-CM | POA: Diagnosis not present

## 2018-06-03 DIAGNOSIS — J3081 Allergic rhinitis due to animal (cat) (dog) hair and dander: Secondary | ICD-10-CM | POA: Diagnosis not present

## 2018-06-03 DIAGNOSIS — J301 Allergic rhinitis due to pollen: Secondary | ICD-10-CM | POA: Diagnosis not present

## 2018-06-03 DIAGNOSIS — J3089 Other allergic rhinitis: Secondary | ICD-10-CM | POA: Diagnosis not present

## 2018-06-12 DIAGNOSIS — J3089 Other allergic rhinitis: Secondary | ICD-10-CM | POA: Diagnosis not present

## 2018-06-12 DIAGNOSIS — J301 Allergic rhinitis due to pollen: Secondary | ICD-10-CM | POA: Diagnosis not present

## 2018-06-12 DIAGNOSIS — J3081 Allergic rhinitis due to animal (cat) (dog) hair and dander: Secondary | ICD-10-CM | POA: Diagnosis not present

## 2018-06-17 DIAGNOSIS — J3089 Other allergic rhinitis: Secondary | ICD-10-CM | POA: Diagnosis not present

## 2018-06-17 DIAGNOSIS — J301 Allergic rhinitis due to pollen: Secondary | ICD-10-CM | POA: Diagnosis not present

## 2018-06-17 DIAGNOSIS — J3081 Allergic rhinitis due to animal (cat) (dog) hair and dander: Secondary | ICD-10-CM | POA: Diagnosis not present

## 2018-06-24 DIAGNOSIS — J3081 Allergic rhinitis due to animal (cat) (dog) hair and dander: Secondary | ICD-10-CM | POA: Diagnosis not present

## 2018-06-24 DIAGNOSIS — J3089 Other allergic rhinitis: Secondary | ICD-10-CM | POA: Diagnosis not present

## 2018-06-24 DIAGNOSIS — J301 Allergic rhinitis due to pollen: Secondary | ICD-10-CM | POA: Diagnosis not present

## 2018-07-02 DIAGNOSIS — J3081 Allergic rhinitis due to animal (cat) (dog) hair and dander: Secondary | ICD-10-CM | POA: Diagnosis not present

## 2018-07-02 DIAGNOSIS — J301 Allergic rhinitis due to pollen: Secondary | ICD-10-CM | POA: Diagnosis not present

## 2018-07-02 DIAGNOSIS — J3089 Other allergic rhinitis: Secondary | ICD-10-CM | POA: Diagnosis not present

## 2018-07-10 DIAGNOSIS — J3089 Other allergic rhinitis: Secondary | ICD-10-CM | POA: Diagnosis not present

## 2018-07-10 DIAGNOSIS — J301 Allergic rhinitis due to pollen: Secondary | ICD-10-CM | POA: Diagnosis not present

## 2018-07-10 DIAGNOSIS — J3081 Allergic rhinitis due to animal (cat) (dog) hair and dander: Secondary | ICD-10-CM | POA: Diagnosis not present

## 2018-07-16 DIAGNOSIS — J3089 Other allergic rhinitis: Secondary | ICD-10-CM | POA: Diagnosis not present

## 2018-07-16 DIAGNOSIS — J3081 Allergic rhinitis due to animal (cat) (dog) hair and dander: Secondary | ICD-10-CM | POA: Diagnosis not present

## 2018-07-16 DIAGNOSIS — J301 Allergic rhinitis due to pollen: Secondary | ICD-10-CM | POA: Diagnosis not present

## 2018-07-22 DIAGNOSIS — J3089 Other allergic rhinitis: Secondary | ICD-10-CM | POA: Diagnosis not present

## 2018-07-22 DIAGNOSIS — J3081 Allergic rhinitis due to animal (cat) (dog) hair and dander: Secondary | ICD-10-CM | POA: Diagnosis not present

## 2018-07-22 DIAGNOSIS — J301 Allergic rhinitis due to pollen: Secondary | ICD-10-CM | POA: Diagnosis not present

## 2018-08-06 DIAGNOSIS — J301 Allergic rhinitis due to pollen: Secondary | ICD-10-CM | POA: Diagnosis not present

## 2018-08-06 DIAGNOSIS — J3081 Allergic rhinitis due to animal (cat) (dog) hair and dander: Secondary | ICD-10-CM | POA: Diagnosis not present

## 2018-08-06 DIAGNOSIS — J3089 Other allergic rhinitis: Secondary | ICD-10-CM | POA: Diagnosis not present

## 2018-08-08 DIAGNOSIS — J301 Allergic rhinitis due to pollen: Secondary | ICD-10-CM | POA: Diagnosis not present

## 2018-08-08 DIAGNOSIS — J3081 Allergic rhinitis due to animal (cat) (dog) hair and dander: Secondary | ICD-10-CM | POA: Diagnosis not present

## 2018-08-08 DIAGNOSIS — R0602 Shortness of breath: Secondary | ICD-10-CM | POA: Diagnosis not present

## 2018-08-12 DIAGNOSIS — J301 Allergic rhinitis due to pollen: Secondary | ICD-10-CM | POA: Diagnosis not present

## 2018-08-12 DIAGNOSIS — J3081 Allergic rhinitis due to animal (cat) (dog) hair and dander: Secondary | ICD-10-CM | POA: Diagnosis not present

## 2018-08-12 DIAGNOSIS — J3089 Other allergic rhinitis: Secondary | ICD-10-CM | POA: Diagnosis not present

## 2018-09-03 ENCOUNTER — Other Ambulatory Visit: Payer: Self-pay | Admitting: Family Medicine

## 2018-09-03 DIAGNOSIS — K7689 Other specified diseases of liver: Secondary | ICD-10-CM

## 2018-09-13 ENCOUNTER — Ambulatory Visit
Admission: RE | Admit: 2018-09-13 | Discharge: 2018-09-13 | Disposition: A | Discharge status: Home / Self Care | Payer: 59 | Source: Ambulatory Visit | Attending: Family Medicine | Admitting: Family Medicine

## 2018-09-13 DIAGNOSIS — K7689 Other specified diseases of liver: Secondary | ICD-10-CM

## 2018-09-13 MED ORDER — GADOXETATE DISODIUM 0.25 MMOL/ML IV SOLN
7.0000 mL | Freq: Once | INTRAVENOUS | Status: AC | PRN
  Administered 2018-09-13: 7 mL via INTRAVENOUS

## 2018-09-13 MED ORDER — GADOBENATE DIMEGLUMINE 529 MG/ML IV SOLN: 15.0000 mL | Freq: Once | INTRAVENOUS | Status: DC | PRN

## 2018-10-03 DIAGNOSIS — N951 Menopausal and female climacteric states: Secondary | ICD-10-CM | POA: Insufficient documentation

## 2019-04-03 DIAGNOSIS — E063 Autoimmune thyroiditis: Secondary | ICD-10-CM | POA: Insufficient documentation

## 2019-05-07 IMAGING — MR MR ABDOMEN WO/W CM
7 of 17 series · 18 of 48 positions shown · IV contrast (7ml eovist)
Comparison: MRI 04/09/2017.  CT scan 03/26/2017.

CLINICAL DATA: Liver lesions.

EXAM:
MRI ABDOMEN WITHOUT AND WITH CONTRAST
TECHNIQUE: Multiplanar multisequence MR imaging of the abdomen was performed
both before and after the administration of intravenous contrast.
CONTRAST:  7mL EOVIST GADOXETATE DISODIUM 0.25 MOL/L IV SOLN

[Series 3: T2 · coronal · 5.0mm · 1.41mm/px · 1 of 32 slices shown]
[im 1/32]
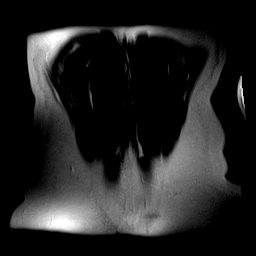

[Series 4: axial in out · axial · 6.0mm · 0.68mm/px · 1 of 72 slices shown]
[im 1/72]
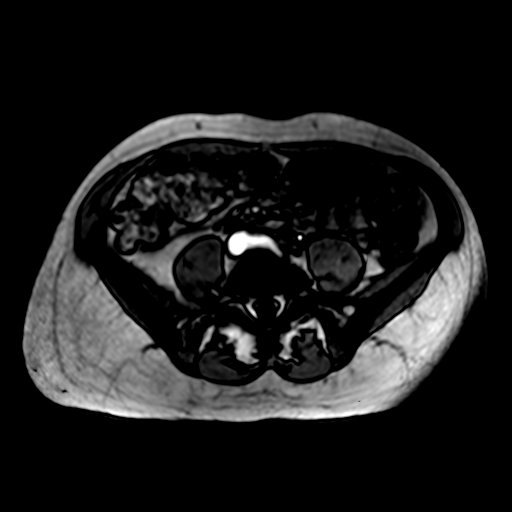

[Series 5: T1 dynamic · axial · non-contrast · 2.2mm · 0.72mm/px · z∈[-102,+125]mm · 3 of 104 slices shown]
[im 1/104]
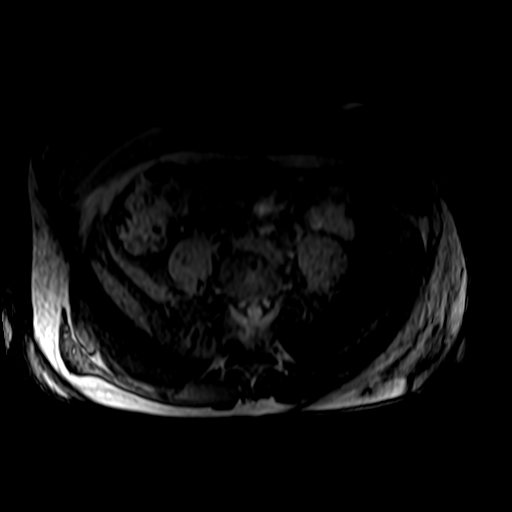
[im 52/104]
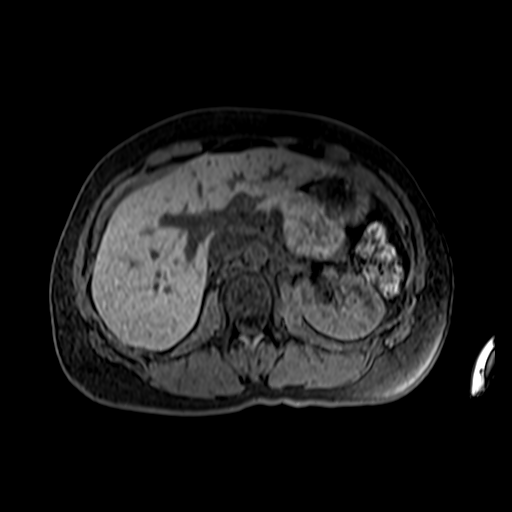
[im 104/104]
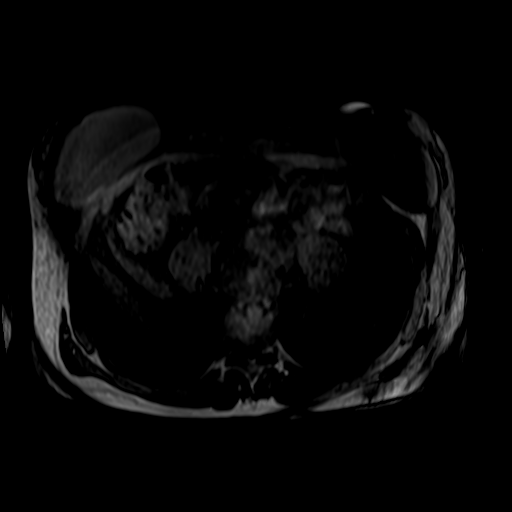

[Series 6: post immed · axial · 2.2mm · 0.72mm/px · z∈[-102,+125]mm · 3 of 104 slices shown]
[im 1/104]
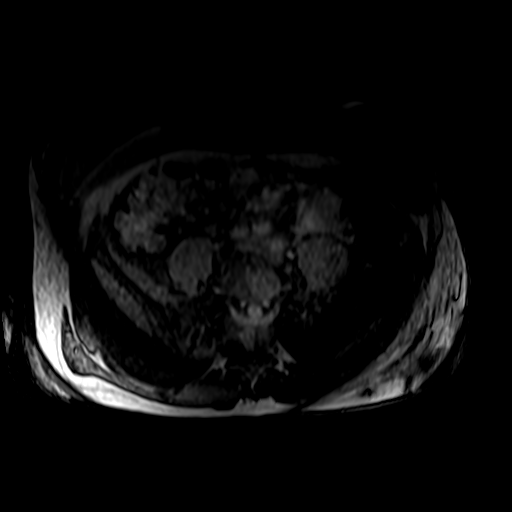
[im 52/104]
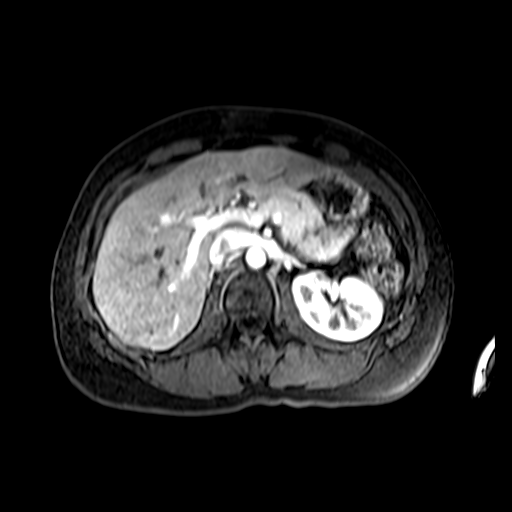
[im 104/104]
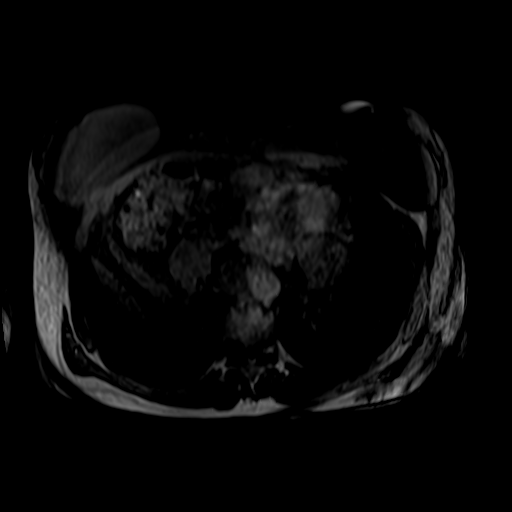

[Series 7: post immed_sub · axial · 2.2mm · 0.72mm/px · z∈[-102,+125]mm · 4 of 104 slices shown]
[im 1/104]
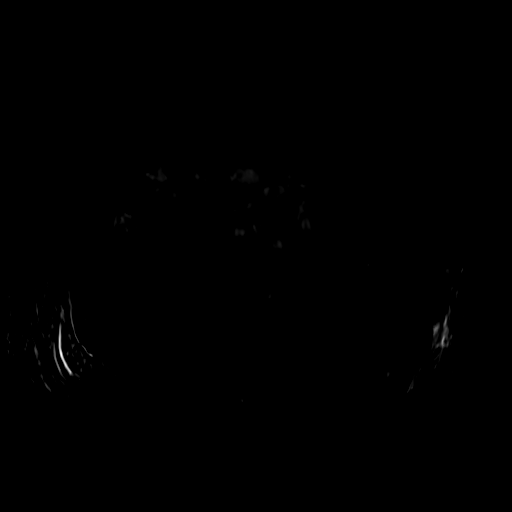
[im 35/104]
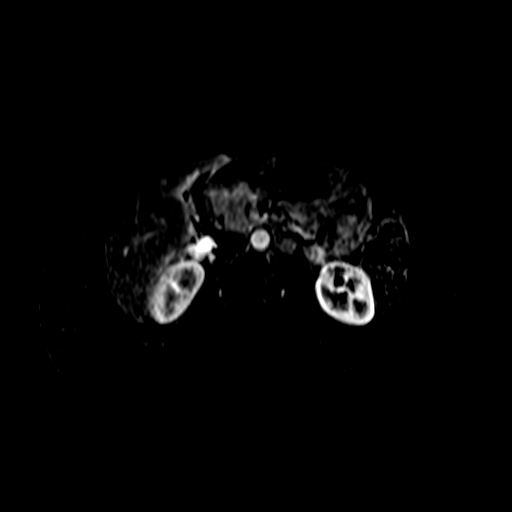
[im 69/104]
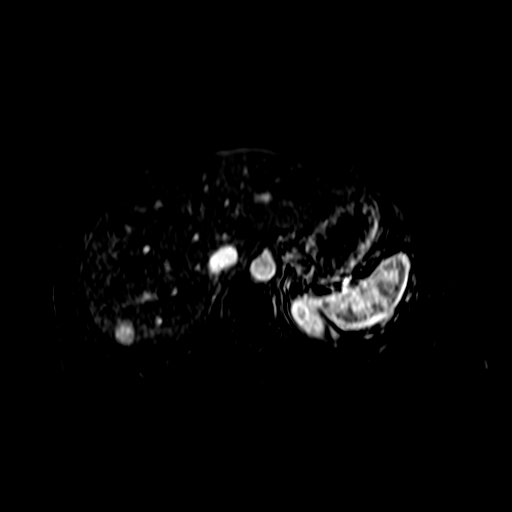
[im 104/104]
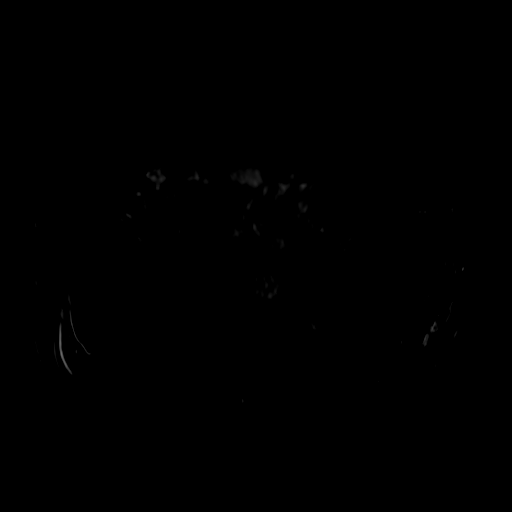

[Series 8: post 45 sec · axial · 2.2mm · 0.72mm/px · z∈[-102,+125]mm · 4 of 104 slices shown]
[im 1/104]
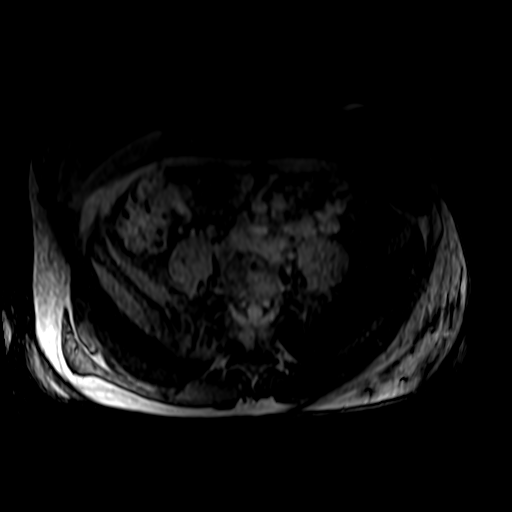
[im 35/104]
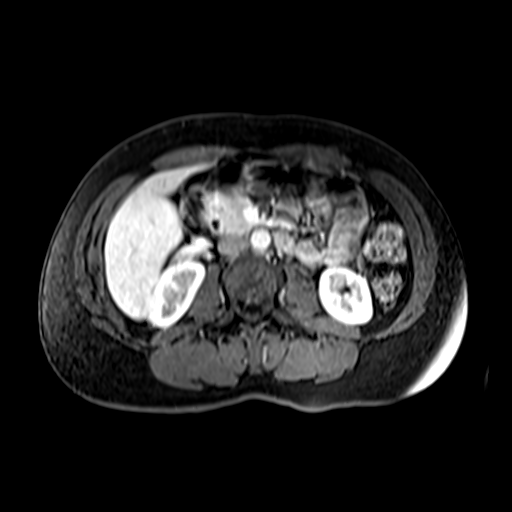
[im 69/104]
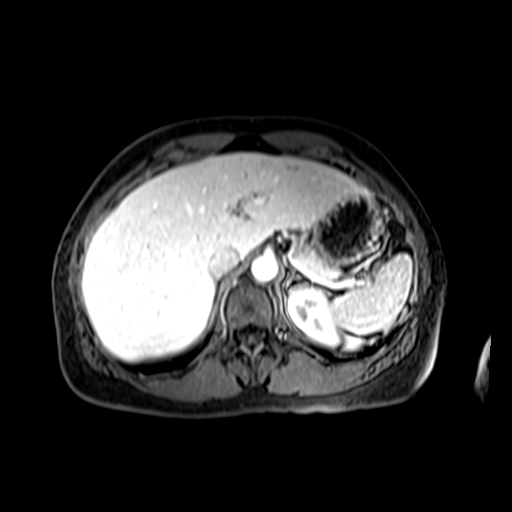
[im 104/104]
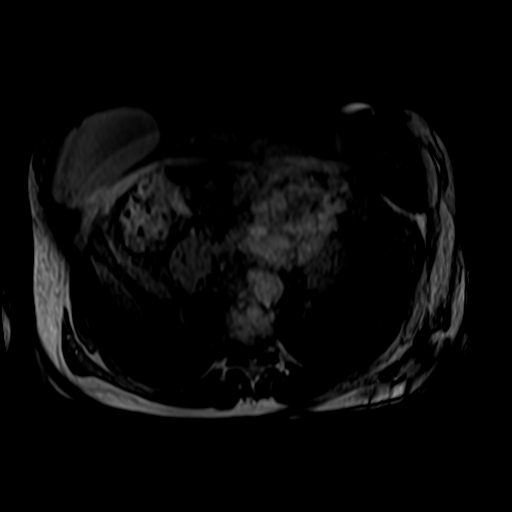

[Series 9: post 45 sec_sub · axial · 2.2mm · 0.72mm/px · z∈[-102,-27]mm · 2 of 104 slices shown]
[im 1/104]
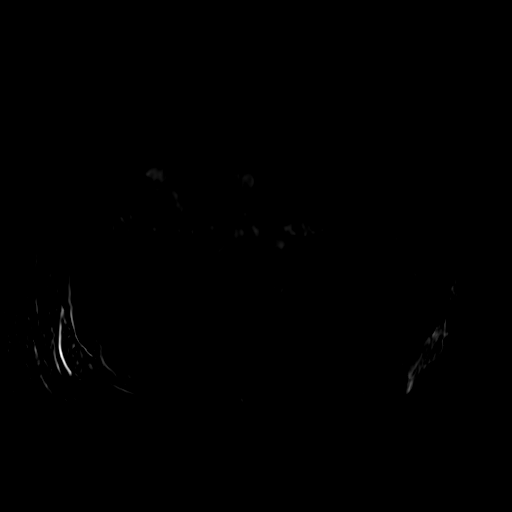
[im 35/104]
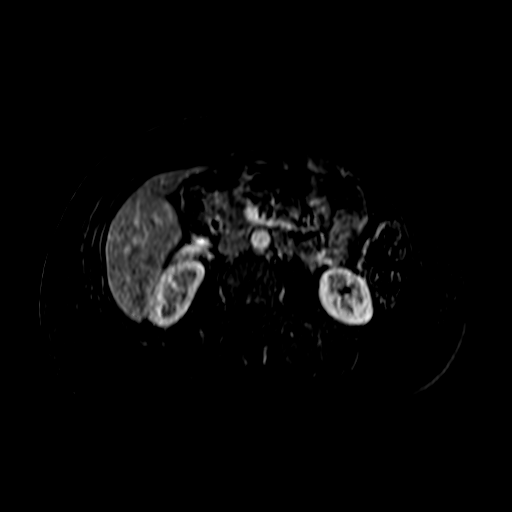

[18 of 48 positions shown; findings below may reference images not displayed]

FINDINGS: Lower chest: Unremarkable.

Hepatobiliary: As on the previous MRI, the 8 mm right hepatic lesion
seen on the CT scan of 03/26/2017 is in apparent.

The 16 x 13 mm ower lesion identified in the posterior right hepatic
lobe (segment VII) is again identified (image 36 series 6). This
lesion is nearly isointense to liver parenchyma on precontrast
imaging, enhances briskly on early postcontrast imaging before
washing out to isointensity. Delayed 20 minutes hepatocyte phase
imaging demonstrates retention of contrast within this lesion. This
constellation of imaging characteristics is compatible with focal
nodular hyperplasia (FNH).

A third lesion in the liver not seen on previous CT but documented
on the previous MRI is not evident by MRI today. The lesion was most
conspicuous on early arterial postcontrast imaging on previous MRI
and was immediately anterior to the intrahepatic IVC, which makes
localization straightforward. Despite this, there is no discernible
differential signal intensity or differential enhancement at this
location on today's MRI.

Pancreas: No focal mass lesion. No dilatation of the main duct. No
intraparenchymal cyst. No peripancreatic edema.

Spleen:  No splenomegaly. No focal mass lesion.

Adrenals/Urinary Tract: No adrenal nodule or mass. Kidneys are
unremarkable.

Stomach/Bowel: Stomach is nondistended. No gastric wall thickening.
No evidence of outlet obstruction. Duodenum is normally positioned
as is the ligament of Treitz. No small bowel or colonic dilatation.

Vascular/Lymphatic: Insert normal scratches that no abdominal aortic
aneurysm. There is no gastrohepatic or hepatoduodenal ligament
lymphadenopathy. No intraperitoneal or retroperitoneal
lymphadenopathy.

Other:  No intraperitoneal free fluid.

Musculoskeletal: No abnormal marrow enhancement within the
visualized bony anatomy
IMPRESSION: 1. Dominant lesion seen on previous MRI in the posterior right liver
(segment VII) is stable and has imaging features on today's exam
consistent with focal nodular hyperplasia (FNH). Follow-up MRI in
6-12 months is recommended to ensure stability.
2. A second tiny liver lesion (segment I anterior to IVC) seen on
previous MRI is not visualized today. The lesion in question on
original CT scan is also not visualized today (and was not
visualized on the prior MRI). These lesions may have resolved or be
inapparent if related to anomalous perfusion. In any case, these are
almost assuredly benign given no demonstrable progression over 5
months (actually no demonstrable residual lesion after 5 months).
These areas could be reassessed at the time of the additional
follow-up MRI recommended above.

## 2020-02-22 ENCOUNTER — Other Ambulatory Visit: Payer: Self-pay | Admitting: Physician Assistant

## 2020-02-22 DIAGNOSIS — Z1231 Encounter for screening mammogram for malignant neoplasm of breast: Secondary | ICD-10-CM

## 2020-03-22 DIAGNOSIS — F411 Generalized anxiety disorder: Secondary | ICD-10-CM | POA: Insufficient documentation

## 2020-04-19 ENCOUNTER — Other Ambulatory Visit: Payer: Self-pay

## 2020-04-19 ENCOUNTER — Ambulatory Visit
Admission: RE | Admit: 2020-04-19 | Discharge: 2020-04-19 | Disposition: A | Discharge status: Home / Self Care | Payer: 59 | Source: Ambulatory Visit | Attending: Physician Assistant | Admitting: Physician Assistant

## 2020-04-19 DIAGNOSIS — Z1231 Encounter for screening mammogram for malignant neoplasm of breast: Secondary | ICD-10-CM

## 2020-04-20 ENCOUNTER — Other Ambulatory Visit: Payer: Self-pay | Admitting: Physician Assistant

## 2020-04-20 DIAGNOSIS — N6310 Unspecified lump in the right breast, unspecified quadrant: Secondary | ICD-10-CM

## 2020-05-27 ENCOUNTER — Other Ambulatory Visit: Payer: Self-pay

## 2020-05-27 ENCOUNTER — Ambulatory Visit
Admission: RE | Admit: 2020-05-27 | Discharge: 2020-05-27 | Disposition: A | Discharge status: Home / Self Care | Payer: 59 | Source: Ambulatory Visit | Attending: Physician Assistant | Admitting: Physician Assistant

## 2020-05-27 ENCOUNTER — Other Ambulatory Visit: Payer: Self-pay | Admitting: Physician Assistant

## 2020-05-27 DIAGNOSIS — N6310 Unspecified lump in the right breast, unspecified quadrant: Secondary | ICD-10-CM

## 2020-12-17 ENCOUNTER — Other Ambulatory Visit: Payer: Self-pay

## 2020-12-17 ENCOUNTER — Emergency Department (HOSPITAL_BASED_OUTPATIENT_CLINIC_OR_DEPARTMENT_OTHER): Payer: 59

## 2020-12-17 ENCOUNTER — Encounter (HOSPITAL_BASED_OUTPATIENT_CLINIC_OR_DEPARTMENT_OTHER): Payer: Self-pay | Admitting: Emergency Medicine

## 2020-12-17 ENCOUNTER — Emergency Department (HOSPITAL_BASED_OUTPATIENT_CLINIC_OR_DEPARTMENT_OTHER)
Admission: EM | Admit: 2020-12-17 | Discharge: 2020-12-17 | Disposition: A | Discharge status: Home / Self Care | Payer: 59 | Attending: Emergency Medicine | Admitting: Emergency Medicine

## 2020-12-17 DIAGNOSIS — R0789 Other chest pain: Secondary | ICD-10-CM | POA: Diagnosis not present

## 2020-12-17 DIAGNOSIS — Z28310 Unvaccinated for covid-19: Secondary | ICD-10-CM | POA: Insufficient documentation

## 2020-12-17 DIAGNOSIS — R0602 Shortness of breath: Secondary | ICD-10-CM | POA: Diagnosis not present

## 2020-12-17 DIAGNOSIS — R053 Chronic cough: Secondary | ICD-10-CM

## 2020-12-17 DIAGNOSIS — R059 Cough, unspecified: Secondary | ICD-10-CM | POA: Insufficient documentation

## 2020-12-17 DIAGNOSIS — U099 Post covid-19 condition, unspecified: Secondary | ICD-10-CM | POA: Insufficient documentation

## 2020-12-17 LAB — CBC WITH DIFFERENTIAL/PLATELET
Abs Immature Granulocytes: 0 10*3/uL (ref 0.00–0.07)
Basophils Absolute: 0 10*3/uL (ref 0.0–0.1)
Basophils Relative: 0 %
Eosinophils Absolute: 0 10*3/uL (ref 0.0–0.5)
Eosinophils Relative: 0 %
HCT: 38 % (ref 36.0–46.0)
Hemoglobin: 12.4 g/dL (ref 12.0–15.0)
Lymphocytes Relative: 51 %
Lymphs Abs: 5.4 10*3/uL — ABNORMAL HIGH (ref 0.7–4.0)
MCH: 29.8 pg (ref 26.0–34.0)
MCHC: 32.6 g/dL (ref 30.0–36.0)
MCV: 91.3 fL (ref 80.0–100.0)
Monocytes Absolute: 0.3 10*3/uL (ref 0.1–1.0)
Monocytes Relative: 3 %
Neutro Abs: 4.8 10*3/uL (ref 1.7–7.7)
Neutrophils Relative %: 46 %
Platelets: 348 10*3/uL (ref 150–400)
RBC: 4.16 MIL/uL (ref 3.87–5.11)
RDW: 13.1 % (ref 11.5–15.5)
WBC: 10.5 10*3/uL (ref 4.0–10.5)
nRBC: 0 % (ref 0.0–0.2)

## 2020-12-17 LAB — BASIC METABOLIC PANEL
Anion gap: 9 (ref 5–15)
BUN: 18 mg/dL (ref 6–20)
CO2: 27 mmol/L (ref 22–32)
Calcium: 9 mg/dL (ref 8.9–10.3)
Chloride: 105 mmol/L (ref 98–111)
Creatinine, Ser: 1.01 mg/dL — ABNORMAL HIGH (ref 0.44–1.00)
GFR, Estimated: >60 mL/min (ref >60)
Glucose, Bld: 107 mg/dL — ABNORMAL HIGH (ref 70–99)
Potassium: 3.3 mmol/L — ABNORMAL LOW (ref 3.5–5.1)
Sodium: 141 mmol/L (ref 135–145)

## 2020-12-17 LAB — BRAIN NATRIURETIC PEPTIDE: B Natriuretic Peptide: 97.4 pg/mL (ref 0.0–100.0)

## 2020-12-17 LAB — D-DIMER, QUANTITATIVE: D-Dimer, Quant: 0.47 ug/mL-FEU (ref 0.00–0.50)

## 2020-12-17 MED ORDER — ONDANSETRON 4 MG PO TBDP
4.0000 mg | ORAL_TABLET | Freq: Once | ORAL | Status: DC
  Filled 2020-12-17: qty 1

## 2020-12-17 MED ORDER — BENZONATATE 100 MG PO CAPS
200.0000 mg | ORAL_CAPSULE | Freq: Once | ORAL | Status: AC
  Administered 2020-12-17: 200 mg via ORAL
  Filled 2020-12-17: qty 2

## 2020-12-17 NOTE — ED Notes (Signed)
Administered tessalon pills, pt declined zofran as not nauseated at this time. Pt drinking water.

## 2020-12-17 NOTE — ED Triage Notes (Signed)
COVID + beginning of May, has been treated with antibiotics and steroids, is on her 2nd treatment now. Awakens with shortness of breath this am.

## 2020-12-17 NOTE — Discharge Instructions (Signed)
You are seen in the ER for shortness of breath.  As discussed, chest x-ray, screening test for blood clot and EKG are overall reassuring.  It is possible that your symptoms are because of scarring from COVID-19 to your lungs.  You could be recovering from it and still manifesting some symptoms versus this could become part of COVID-19 long-haul syndrome.  We would want you to see pulmonologist to get better idea of the status of your lungs.  As discussed, the screening test for blood clot is negative.  However, if you start having worsening shortness of breath, near fainting spells, chest pain with breathing, bloody phlegm and cough  -please return to the ER.

## 2020-12-17 NOTE — ED Provider Notes (Signed)
[facility] EMERGENCY DEPT Provider Note   CSN: [id number] Arrival date & time: 12/17/20  0800     History Chief Complaint  Patient presents with  . Shortness of Breath    Laurie Meyer is a 55 y.o. female.  HPI     55 year old female comes in a chief complaint of shortness of breath. Patient has no significant medical history.  She reports that she started developing shortness of breath and chest discomfort starting yesterday.  Shortness of breath is exertional, and she also feels that way when she is laying flat.    Patient developed COVID-19 in April.  She was unvaccinated and was getting better, therefore did not receive any monoclonal antibody or antiviral treatments.  Since that time, she has had 2 rounds of antibiotics and also prednisone.  She continues to have persistent cough.  Cough is mostly nonproductive.  Patient is also having nausea without vomiting.  Past Medical History:  Diagnosis Date  . Anal fissure     Patient Active Problem List   Diagnosis Date Noted  . Acute appendicitis, uncomplicated 01/01/2018    Past Surgical History:  Procedure Laterality Date  . AUGMENTATION MAMMAPLASTY Bilateral   . BREAST ENHANCEMENT SURGERY  2001  . CHOLECYSTECTOMY  2005  . LAPAROSCOPIC APPENDECTOMY N/A 01/01/2018   Procedure: APPENDECTOMY LAPAROSCOPIC;  Surgeon: Emelia Loron, MD;  Location: [facility];  Service: General;  Laterality: N/A;  . NASAL SEPTUM SURGERY  2007     OB History   No obstetric history on file.     Family History  Problem Relation Age of Onset  . Cancer Maternal Uncle        lung    Social History   Tobacco Use  . Smoking status: Never Smoker  . Smokeless tobacco: Never Used  Substance Use Topics  . Alcohol use: Yes    Comment: Special Occasions.  . Drug use: No    Home Medications Prior to Admission medications   Medication Sig Start Date End Date Taking? Authorizing Provider  acetaminophen (TYLENOL) 500 MG  tablet Take 2 tablets (1,000 mg total) by mouth every 8 (eight) hours. 01/01/18  Yes Meuth, Brooke A, PA-C  citalopram (CELEXA) 10 MG tablet Take 10 mg by mouth daily.   Yes [provider]  doxycycline (DORYX) 100 MG EC tablet Take 100 mg by mouth 2 (two) times daily.   Yes [provider]  levothyroxine (SYNTHROID) 88 MCG tablet Take 88 mcg by mouth daily before breakfast.   Yes [provider]  omeprazole (PRILOSEC) 40 MG capsule Take 40 mg by mouth daily.   Yes [provider]  predniSONE (DELTASONE) 50 MG tablet Take 50 mg by mouth daily with breakfast.   Yes [provider]  AVIANE 0.1-20 MG-MCG tablet daily. 10/02/11   [provider]  ibuprofen (ADVIL,MOTRIN) 400 MG tablet Take 1 tablet (400 mg total) by mouth every 8 (eight) hours. 01/01/18   Meuth, Lina Sar, PA-C  linaclotide (LINZESS) 290 MCG CAPS capsule Take 290 mg by mouth daily. 10/18/17   [provider]  oxyCODONE (OXY IR/ROXICODONE) 5 MG immediate release tablet Take 1 tablet (5 mg total) by mouth every 6 (six) hours as needed for severe pain. 01/01/18   Meuth, Brooke A, PA-C  ranitidine (ZANTAC) 150 MG tablet Take 150 mg by mouth at bedtime. 11/06/17   [provider]    Allergies    Patient has no known allergies.  Review of Systems   Review  of Systems  Constitutional: Positive for activity change.  Respiratory: Positive for shortness of breath.   Gastrointestinal: Positive for nausea. Negative for vomiting.  Allergic/Immunologic: Negative for immunocompromised state.  Hematological: Does not bruise/bleed easily.  All other systems reviewed and are negative.   Physical Exam Updated Vital Signs BP 136/87 (BP Location: Left Arm)   Pulse 65   Temp 98.3 F (36.8 C) (Oral)   Resp 15   Ht 5\' 8"  (1.727 m)   Wt 68 kg   LMP 12/08/2017 Comment: negative beta HCG 01/01/18  SpO2 100%   BMI 22.79 kg/m   Physical Exam Vitals and nursing note reviewed.   Constitutional:      Appearance: She is well-developed.  HENT:     Head: Normocephalic and atraumatic.  Eyes:     Pupils: Pupils are equal, round, and reactive to light.  Cardiovascular:     Rate and Rhythm: Normal rate and regular rhythm.     Heart sounds: Normal heart sounds. No murmur heard.   Pulmonary:     Effort: Pulmonary effort is normal. No respiratory distress.     Breath sounds: No decreased breath sounds, wheezing, rhonchi or rales.  Abdominal:     General: There is no distension.  Musculoskeletal:     Cervical back: Neck supple.  Skin:    General: Skin is warm and dry.  Neurological:     Mental Status: She is alert and oriented to person, place, and time.     ED Results / Procedures / Treatments   Labs (all labs ordered are listed, but only abnormal results are displayed) Labs Reviewed  BASIC METABOLIC PANEL - Abnormal; Notable for the following components:      Result Value   Potassium 3.3 (*)    Glucose, Bld 107 (*)    Creatinine, Ser 1.01 (*)    All other components within normal limits  CBC WITH DIFFERENTIAL/PLATELET - Abnormal; Notable for the following components:   Lymphs Abs 5.4 (*)    All other components within normal limits  D-DIMER, QUANTITATIVE  BRAIN NATRIURETIC PEPTIDE    EKG EKG Interpretation  Date/Time:  Saturday Dec 17 2020 08:59:48 EDT Ventricular Rate:  75 PR Interval:  150 QRS Duration: 85 QT Interval:  378 QTC Calculation: 423 R Axis:   79 Text Interpretation: Sinus rhythm inferior and lateral q waves without associated ST changes No old tracing to compare Confirmed by 02-29-2000 Derwood Kaplan) on 12/17/2020 9:12:26 AM   Radiology DG Chest Port 1 View  Result Date: 12/17/2020 CLINICAL DATA:  55 year old female with acute shortness of breath and cough. EXAM: PORTABLE CHEST 1 VIEW COMPARISON:  02/23/2016 chest radiograph FINDINGS: The cardiomediastinal silhouette is unremarkable. There is no evidence of focal airspace disease,  pulmonary edema, suspicious pulmonary nodule/mass, pleural effusion, or pneumothorax. No acute bony abnormalities are identified. IMPRESSION: No active disease. Electronically Signed   By: 02/25/2016 M.D.   On: 12/17/2020 09:31    Procedures Procedures   Medications Ordered in ED Medications  ondansetron (ZOFRAN-ODT) disintegrating tablet 4 mg (has no administration in time range)  benzonatate (TESSALON) capsule 200 mg (200 mg Oral Given 12/17/20 0909)    ED Course  I have reviewed the triage vital signs and the nursing notes.  Pertinent labs & imaging results that were available during my care of the patient were reviewed by me and considered in my medical decision making (see chart for details).  Clinical Course as of 12/17/20 1037  Sat Dec 17, 2020  1036 D-Dimer, Quant: 0.47 BNP slightly elevated, D-dimer is at 0.47 Chest x-ray is reassuring.  EKG did not have S1Q3T3 and patient has not had any tachycardia.  Discussed with the patient that COVID-related inflammatory changes, scarring leading to shortness of breath is higher now differential versus PE.  For now, we are comfortable not proceeding with CT PE in the setting of her having D-dimer that is below the institutional cutoff.  However, if patient's symptoms worsens, and we discussed all return precautions, she will return to the ER and we will have to reassess her for PE work-up.  Outpatient COVID-19 clinic follow-up instruction provided. [AN]    Clinical Course User Index [AN] Derwood Kaplan, MD   MDM Rules/Calculators/A&P                          55 year old comes in a chief complaint of shortness of breath.  She is having some chest discomfort and shortness of breath, the latter developed yesterday.  Her symptoms are exertional, but also present at rest.  Patient is quite healthy overall.  She did unfortunately develop COVID-19 earlier this month and has suffered with persistent cough since then.  She has undergone 2  rounds of antibiotics, and despite that, the shortness of breath is actually new.  I will get basic labs to ensure there is no significant anemia, AKI.  Suspicion for CHF, PE, pleural effusion, long hauler COVID syndrome.  Final Clinical Impression(s) / ED Diagnoses Final diagnoses:  Shortness of breath  Post-COVID chronic cough    Rx / DC Orders ED Discharge Orders    None       Derwood Kaplan, MD 12/17/20 1037

## 2020-12-21 ENCOUNTER — Other Ambulatory Visit: Payer: Self-pay

## 2020-12-21 ENCOUNTER — Encounter: Payer: Self-pay | Admitting: Internal Medicine

## 2020-12-21 ENCOUNTER — Ambulatory Visit (INDEPENDENT_AMBULATORY_CARE_PROVIDER_SITE_OTHER): Payer: 59 | Admitting: Internal Medicine

## 2020-12-21 VITALS — BP 112/74 | HR 78 | Temp 98.0°F | Ht 68.0 in | Wt 186.4 lb

## 2020-12-21 DIAGNOSIS — J387 Other diseases of larynx: Secondary | ICD-10-CM | POA: Diagnosis not present

## 2020-12-21 DIAGNOSIS — U099 Post covid-19 condition, unspecified: Secondary | ICD-10-CM | POA: Diagnosis not present

## 2020-12-21 DIAGNOSIS — M2609 Other specified anomalies of jaw size: Secondary | ICD-10-CM

## 2020-12-21 DIAGNOSIS — R053 Chronic cough: Secondary | ICD-10-CM

## 2020-12-21 MED ORDER — BREO ELLIPTA 100-25 MCG/INH IN AEPB: 1.0000 | INHALATION_SPRAY | Freq: Every day | RESPIRATORY_TRACT | 0 refills | Status: AC

## 2020-12-21 NOTE — Patient Instructions (Addendum)
ICD-10-CM   1. 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  Symptoms usually resolve in a few weeks to few months Unfortunately there is no specific treatment for it The main thing is to manage the nerve fiber irritability so that it does not become chronic  Plan - If you need to take a few days off from work please let us know and we can do a sick note -But take any 2 or 3 days consecutive block and observe total voice rest where all you do is text or type.  You should not even whisper -Drink water every time you feel the urge to cough -Suck on sugarless lozenge or peppermint -Okay to take over-the-counter cough medications such as Delsym to suppress the cough -Can try empiric inhaler Breo 1 puff once daily [take samples]  -Please note the dry powder can increase your cough paradoxically but typically is only transient -Shortness of breath and fatigue should also improve with time with the cough -Hold of gabapentin for the moment [noted that your husband had bad side effects of gabapentin in the past]   Follow-up - Call us if things are getting worse -4 weeks telephone visit with nurse practitioner to see course

## 2020-12-21 NOTE — Progress Notes (Signed)
OV 12/21/2020  Subjective:  Patient ID: Laurie Meyer, female , DOB: 1965-10-09 , age 55 y.o. , MRN: [id number] , ADDRESS: [id number] [place] [address] PCP Jordan Hawks, PA-C Patient Care Team: Barbarann Ehlers as PCP - General (Physician Assistant)  This Provider for this visit: Treatment Team:  Attending Provider: Tomma Lightning, MD    12/21/2020 -   Chief Complaint  Patient presents with  . Consult    Pt is coming due to having a cough after being diagnosed with Covid 4/28. Pt has problems with the cough all the time. States she only coughs up phlegm when in the shower. Also has complaints of wheezing and SOB due to the cough.     HPI Laurie Meyer 55 y.o. -on November 24, 2020 had omicron variant COVID-19 and had sore throat and fever and diarrhea.  No loss of taste or smell.  She says she was sick for 10 days.  She normally works in [employer] and has to talk a lot.  She says after that she has been left with residual cough associate with some fatigue and shortness of breath and may be rare wheezing.  She feels the sputum and said that she not able to bring out.  The cough is moderate in intensity.  Does not disturb her much at night.  But when she gets up she has significant cough.  She is clearing her throat all the time.  The cough is making her quality of life quite miserable but overall moderate in severity.  She ended up in the ER on 12/17/2020.  Chest x-ray clear.  Personally visualized.  D-dimer normal.  She is set to 5-day course of prednisone and antibiotic without any relief.  Asked to see pulmonary.  Prior to COVID previously well without any health issues.  Of note she gives a history of snoring but no excessive daytime somnolence  Results for DAYTONA, HEDMAN (MRN [id number]) as of 12/21/2020 13:51  Ref. Range 12/17/2020 08:43  D-Dimer, Quant Latest Ref Range: 0.00 - 0.50 ug/mL-FEU 0.47    No results found.   PFT  No  flowsheet data found.     has a past medical history of Anal fissure.   reports that she has never smoked. She has never used smokeless tobacco.  Past Surgical History:  Procedure Laterality Date  . AUGMENTATION MAMMAPLASTY Bilateral   . BREAST ENHANCEMENT SURGERY  2001  . CHOLECYSTECTOMY  2005  . LAPAROSCOPIC APPENDECTOMY N/A 01/01/2018   Procedure: APPENDECTOMY LAPAROSCOPIC;  Surgeon: Emelia Loron, MD;  Location: [facility];  Service: General;  Laterality: N/A;  . NASAL SEPTUM SURGERY  2007    No Known Allergies  Immunization History  Administered Date(s) Administered  . Influenza, High Dose Seasonal PF 05/22/2017, 08/07/2019, 08/05/2020  . Zoster Recombinat (Shingrix) 09/28/2019, 12/29/2019    Family History  Problem Relation Age of Onset  . Cancer Maternal Uncle        lung     Current Outpatient Medications:  .  citalopram (CELEXA) 10 MG tablet, Take 10 mg by mouth daily., Disp: , Rfl:  .  fluticasone furoate-vilanterol (BREO ELLIPTA) 100-25 MCG/INH AEPB, Inhale 1 puff into the lungs daily., Disp: 2 each, Rfl: 0 .  ibuprofen (ADVIL,MOTRIN) 400 MG tablet, Take 1 tablet (400 mg total) by mouth every 8 (eight) hours., Disp: 30 tablet, Rfl: 0 .  levothyroxine (SYNTHROID) 88 MCG tablet, Take 88 mcg by  mouth daily before breakfast., Disp: , Rfl:  .  omeprazole (PRILOSEC) 40 MG capsule, Take 40 mg by mouth daily., Disp: , Rfl:       Objective:   Vitals:   12/21/20 1338  BP: 112/74  Pulse: 78  Temp: 98 F (36.7 C)  TempSrc: Temporal  SpO2: 98%  Weight: 186 lb 6.4 oz (84.6 kg)  Height: 5\' 8"  (1.727 m)    Estimated body mass index is 28.34 kg/m as calculated from the following:   Height as of this encounter: 5\' 8"  (1.727 m).   Weight as of this encounter: 186 lb 6.4 oz (84.6 kg).  @WEIGHTCHANGE @    12/21/20 1338  Weight: 186 lb 6.4 oz (84.6 kg)     Physical Exam  General Appearance:    Alert, cooperative, no distress, appears stated age -  yes , Deconditioned looking - no , OBESE  - no, Sitting on Wheelchair -  no  Head:    Normocephalic, without obvious abnormality, atraumatic  Eyes:    PERRL, conjunctiva/corneas clear,  Ears:    Normal TM's and external ear canals, both ears  Nose:   Nares normal, septum midline, mucosa normal, no drainage    or sinus tenderness. OXYGEN ON  - no . Patient is @ ra   Throat:   Lips, mucosa, and tongue normal; teeth and gums normal. Cyanosis on lips - no.  Micrognathia present  Neck:   Supple, symmetrical, trachea midline, no adenopathy;    thyroid:  no enlargement/tenderness/nodules; no carotid   bruit or JVD  Back:     Symmetric, no curvature, ROM normal, no CVA tenderness  Lungs:     Distress - no , Wheeze no, Barrell Chest - no, Purse lip breathing - no, Crackles - no   Chest Wall:    No tenderness or deformity.    Heart:    Regular rate and rhythm, S1 and S2 normal, no rub   or gallop, Murmur - no  Breast Exam:    NOT DONE  Abdomen:     Soft, non-tender, bowel sounds active all four quadrants,    no masses, no organomegaly. Visceral obesity - no  Genitalia:   NOT DONE  Rectal:   NOT DONE  Extremities:   Extremities - normal, Has Cane - no, Clubbing - no, Edema - no  Pulses:   2+ and symmetric all extremities  Skin:   Stigmata of Connective Tissue Disease - no  Lymph nodes:   Cervical, supraclavicular, and axillary nodes normal  Psychiatric:  Neurologic:   Pleasant - yes, Anxious - no, Flat affect - no  CAm-ICU - neg, Alert and Oriented x 3 - yes, Moves all 4s - yes, Speech - normal, Cognition - intact        Assessment:       ICD-10-CM   1. 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  Symptoms usually resolve in a few weeks to few months Unfortunately there is no specific treatment for it The  main thing is to manage the nerve fiber irritability so that it does not become chronic  Plan - If you need to take a few days off from work please let us know and we can do a sick note -But take any 2 or 3 days consecutive block and observe total voice rest where all you do is text or type.  You should not even whisper -Drink water every time you feel the urge to cough -Suck on sugarless lozenge or peppermint -Okay to take over-the-counter cough medications such as Delsym to suppress the cough -Can try empiric inhaler Breo 1 puff once daily [take samples]  -Please note the dry powder can increase your cough paradoxically but typically is only transient -Shortness of breath and fatigue should also improve with time with the cough -Hold of gabapentin for the moment [noted that your husband had bad side effects of gabapentin in the past]   Follow-up - Call us if things are getting worse -4 weeks telephone visit with nurse practitioner to see course     SIGNATURE    Dr. Kalman Shan, M.D., F.C.C.P,  Pulmonary and Critical Care Medicine Staff Physician, [facility] Center Director - Interstitial Lung Disease  Program  Pulmonary Fibrosis [facility] [place], [place], [address]  Pager: [phone], If no answer or between  15:00h - 7:00h: call [phone] Telephone: [phone]  2:10 PM 12/21/2020

## 2020-12-23 ENCOUNTER — Institutional Professional Consult (permissible substitution): Payer: 59 | Admitting: Pulmonary Disease

## 2021-01-11 DIAGNOSIS — E042 Nontoxic multinodular goiter: Secondary | ICD-10-CM | POA: Insufficient documentation

## 2021-01-18 ENCOUNTER — Encounter: Payer: Self-pay | Admitting: Acute Care

## 2021-01-18 ENCOUNTER — Ambulatory Visit (INDEPENDENT_AMBULATORY_CARE_PROVIDER_SITE_OTHER): Payer: 59 | Admitting: Acute Care

## 2021-01-18 ENCOUNTER — Other Ambulatory Visit: Payer: Self-pay

## 2021-01-18 VITALS — BP 106/80 | HR 96 | Temp 98.3°F | Ht 68.0 in | Wt 182.4 lb

## 2021-01-18 DIAGNOSIS — R053 Chronic cough: Secondary | ICD-10-CM

## 2021-01-18 DIAGNOSIS — U099 Post covid-19 condition, unspecified: Secondary | ICD-10-CM | POA: Diagnosis not present

## 2021-01-18 NOTE — Patient Instructions (Signed)
It is good to see you today. I am glad your cough is better. We will start Mucinex 1200 mg once daily with a full glass of water. Then use flutter valve 4-6 times daily. You can use You Tube as a good instruction video for flutter valve use.  Let us know if you would like to have another sample of Breo for your cough. Sips of water instead of throat clearing Sugar Free Coca-Cola or Werther's originals for throat soothing. Delsym Cough syrup 5 cc's every 12 hours Follow up in 1 month with Laurie Sago NP or Laurie Meyer Please contact office for sooner follow up if symptoms do not improve or worsen or seek emergency care

## 2021-01-18 NOTE — Progress Notes (Signed)
History of Present Illness Laurie Meyer is a 55 y.o. female diagnosed with Covid 13, omicron variant ( 11/24/2020). She developed a post viral cough, with shortness of breath and fatigue. She was seen by Dr. Marchelle Gearing as consult 12/21/2020  Patient developed COVID-19 in April.  She was unvaccinated and was getting better, therefore did not receive any monoclonal antibody or antiviral treatments.  Since that time, she has had 2 rounds of antibiotics and also prednisone.  She continues to have persistent cough. She was referred to pulmonary.   01/18/2021 Pt. Presents for follow up. She was seen by Dr. Marchelle Gearing 11/2020 as a consult for post viral cough . She was treated with voice rest, Cough suppressant medications, a Breo inhaler once daily.She states she has been doing well.  The cough has changed. The cough is a dry cough with an expiratory wheeze. It happens once or twice, them resolves. She said she has finished the two Breo samples. She has been off that for 2 days. She does not want to repeat the breo at present. She does wake up with chest congestion every morning. Secretion are clear.She is able to cough this up in the shower. She has not used any cough suppressants. She was prescribed tessalon perles, but has not used them. She does have chest congestion. Secretions are clear.  Test Results:  12/17/2020 CXR There is no evidence of focal airspace disease, pulmonary edema, suspicious pulmonary nodule/mass, pleural effusion, or pneumothorax. No acute bony abnormalities are identified.   IMPRESSION: No active disease.     11/25/18 CXR Both lungs are clear without infiltrates, pleural effusions or pulmonary nodules.  Cardiomediastinal contours are normal. There is no evidence of adenopathy or pneumothorax.  No acute bony abnormalities are visible. Bilateral breast implants are present with capsular calcifications.    CBC Latest Ref Rng & Units 12/17/2020 01/01/2018  WBC 4.0 - 10.5 K/uL  10.5 14.2(H)  Hemoglobin 12.0 - 15.0 g/dL 37.2 90.2  Hematocrit 11.1 - 46.0 % 38.0 37.2  Platelets 150 - 400 K/uL 348 256    BMP Latest Ref Rng & Units 12/17/2020 01/01/2018  Glucose 70 - 99 mg/dL 552(C) 802(M)  BUN 6 - 20 mg/dL 18 13  Creatinine 3.36 - 1.00 mg/dL 1.22(E) 4.97  Sodium 530 - 145 mmol/L 141 139  Potassium 3.5 - 5.1 mmol/L 3.3(L) 4.0  Chloride 98 - 111 mmol/L 105 106  CO2 22 - 32 mmol/L 27 27  Calcium 8.9 - 10.3 mg/dL 9.0 8.9    BNP    Component Value Date/Time   BNP 97.4 12/17/2020 0843    ProBNP No results found for: PROBNP  PFT No results found for: FEV1PRE, FEV1POST, FVCPRE, FVCPOST, TLC, DLCOUNC, PREFEV1FVCRT, PSTFEV1FVCRT  No results found.   Past medical hx Past Medical History:  Diagnosis Date   Anal fissure      Social History   Tobacco Use   Smoking status: Never   Smokeless tobacco: Never  Substance Use Topics   Alcohol use: Yes    Comment: Special Occasions.   Drug use: No    Ms.Buchner reports that she has never smoked. She has never used smokeless tobacco. She reports current alcohol use. She reports that she does not use drugs.  Tobacco Cessation: Never smoker   Past surgical hx, Family hx, Social hx all reviewed.  Current Outpatient Medications on File Prior to Visit  Medication Sig   citalopram (CELEXA) 10 MG tablet Take 10 mg by mouth daily.  fluticasone furoate-vilanterol (BREO ELLIPTA) 100-25 MCG/INH AEPB Inhale 1 puff into the lungs daily.   ibuprofen (ADVIL,MOTRIN) 400 MG tablet Take 1 tablet (400 mg total) by mouth every 8 (eight) hours.   levothyroxine (SYNTHROID) 88 MCG tablet Take 88 mcg by mouth daily before breakfast.   omeprazole (PRILOSEC) 40 MG capsule Take 40 mg by mouth daily.   No current facility-administered medications on file prior to visit.     No Known Allergies  Review Of Systems:  Constitutional:   No  weight loss, night sweats,  Fevers, chills, fatigue, or  lassitude.  HEENT:   No  headaches,  Difficulty swallowing,  Tooth/dental problems, or  Sore throat,                No sneezing, itching, ear ache, nasal congestion, post nasal drip,   CV:  No chest pain,  Orthopnea, PND, swelling in lower extremities, anasarca, dizziness, palpitations, syncope.   GI  No heartburn, indigestion, abdominal pain, nausea, vomiting, diarrhea, change in bowel habits, loss of appetite, bloody stools.   Resp: No shortness of breath with exertion or at rest.  No excess mucus, + productive cough,  + non-productive cough,  No coughing up of blood.  No change in color of mucus.  + expiratory wheeze with cough  No chest wall deformity  Skin: no rash or lesions.  GU: no dysuria, change in color of urine, no urgency or frequency.  No flank pain, no hematuria   MS:  No joint pain or swelling.  No decreased range of motion.  No back pain.  Psych:  No change in mood or affect. No depression or anxiety.  No memory loss.   Vital Signs LMP 12/08/2017 Comment: negative beta HCG 01/01/18   Physical Exam:  General- No distress,  A&Ox3, pleasant ENT: No sinus tenderness, TM clear, pale nasal mucosa, no oral exudate,no post nasal drip, no LAN Cardiac: S1, S2, regular rate and rhythm, no murmur Chest: No wheeze/ rales/ dullness; no accessory muscle use, no nasal flaring, no sternal retractions Abd.: Soft Non-tender, ND, BS +, Body mass index is 27.73 kg/m.  Ext: No clubbing cyanosis, edema Neuro:  normal strength, MAE x 4, A&O x 3 Skin: No rashes, warm and dry, intact Psych: normal mood and behavior   Assessment/Plan  Post Covid Viral Cough Better after voice rest/Breo Inhaler x 1 month New chest congestion with clear secretions/ no fever  Plan We will start Mucinex 1200 mg once daily with a full glass of water. Then use flutter valve 4-6 times daily. You can use You Tube as a good instruction video for flutter valve use.  Let us know if you would like to have another sample of Breo for your  cough. Sips of water instead of throat clearing Sugar Free Coca-Cola or Werther's originals for throat soothing. Delsym Cough syrup 5 cc's every 12 hours Follow up in 1 month with Teneil Shiller NP or Dr. Marchelle Gearing Please contact office for sooner follow up if symptoms do not improve or worsen or seek emergency care    I spent 35 minutes dedicated to the care of this patient on the date of this encounter to include pre-visit review of records, face-to-face time with the patient discussing conditions above, post visit ordering of testing, clinical documentation with the electronic health record, making appropriate referrals as documented, and communicating necessary information to the patient's healthcare team.     Bevelyn Ngo, NP 01/18/2021  4:28 PM

## 2021-02-14 DIAGNOSIS — R7303 Prediabetes: Secondary | ICD-10-CM | POA: Insufficient documentation

## 2021-02-20 ENCOUNTER — Ambulatory Visit: Payer: 59 | Admitting: Acute Care

## 2021-06-06 DIAGNOSIS — M797 Fibromyalgia: Secondary | ICD-10-CM | POA: Insufficient documentation

## 2021-07-29 ENCOUNTER — Other Ambulatory Visit: Payer: Self-pay

## 2021-07-29 ENCOUNTER — Emergency Department (HOSPITAL_BASED_OUTPATIENT_CLINIC_OR_DEPARTMENT_OTHER)
Admission: EM | Admit: 2021-07-29 | Discharge: 2021-07-29 | Discharge status: Against Medical Advice | Payer: 59 | Source: Home / Self Care

## 2021-10-12 DIAGNOSIS — E559 Vitamin D deficiency, unspecified: Secondary | ICD-10-CM | POA: Insufficient documentation

## 2022-06-25 DIAGNOSIS — E611 Iron deficiency: Secondary | ICD-10-CM | POA: Insufficient documentation

## 2022-10-08 ENCOUNTER — Other Ambulatory Visit: Payer: Self-pay | Admitting: Internal Medicine

## 2022-10-08 ENCOUNTER — Other Ambulatory Visit: Payer: Self-pay

## 2022-10-08 DIAGNOSIS — E042 Nontoxic multinodular goiter: Secondary | ICD-10-CM

## 2022-10-11 ENCOUNTER — Ambulatory Visit
Admission: RE | Admit: 2022-10-11 | Discharge: 2022-10-11 | Disposition: A | Discharge status: Home / Self Care | Payer: 59 | Source: Ambulatory Visit | Attending: Internal Medicine | Admitting: Internal Medicine

## 2022-10-11 DIAGNOSIS — E042 Nontoxic multinodular goiter: Secondary | ICD-10-CM

## 2022-10-16 DIAGNOSIS — N941 Unspecified dyspareunia: Secondary | ICD-10-CM | POA: Insufficient documentation

## 2022-12-11 ENCOUNTER — Ambulatory Visit (INDEPENDENT_AMBULATORY_CARE_PROVIDER_SITE_OTHER): Payer: 59 | Admitting: Podiatry

## 2022-12-11 DIAGNOSIS — L603 Nail dystrophy: Secondary | ICD-10-CM | POA: Diagnosis not present

## 2022-12-12 NOTE — Progress Notes (Signed)
  Subjective:  Patient ID: Laurie Meyer, female    DOB: 01-25-1966,  MRN: [id number] HPI No chief complaint on file.   57 y.o. female presents with the above complaint.   ROS: Denies fever chills nausea vomit muscle aches pains calf pain back pain chest pain shortness of breath  Past Medical History:  Diagnosis Date   Anal fissure    Past Surgical History:  Procedure Laterality Date   AUGMENTATION MAMMAPLASTY Bilateral    BREAST ENHANCEMENT SURGERY  2001   CHOLECYSTECTOMY  2005   LAPAROSCOPIC APPENDECTOMY N/A 01/01/2018   Procedure: APPENDECTOMY LAPAROSCOPIC;  Surgeon: Emelia Loron, MD;  Location: [facility];  Service: General;  Laterality: N/A;   NASAL SEPTUM SURGERY  2007    Current Outpatient Medications:    citalopram (CELEXA) 10 MG tablet, Take 10 mg by mouth daily., Disp: , Rfl:    fluticasone furoate-vilanterol (BREO ELLIPTA) 100-25 MCG/INH AEPB, Inhale 1 puff into the lungs daily. (Patient not taking: Reported on 01/18/2021), Disp: 2 each, Rfl: 0   ibuprofen (ADVIL,MOTRIN) 400 MG tablet, Take 1 tablet (400 mg total) by mouth every 8 (eight) hours., Disp: 30 tablet, Rfl: 0   levothyroxine (SYNTHROID) 88 MCG tablet, Take 88 mcg by mouth daily before breakfast., Disp: , Rfl:    omeprazole (PRILOSEC) 40 MG capsule, Take 40 mg by mouth daily., Disp: , Rfl:   No Known Allergies Review of Systems Objective:  There were no vitals filed for this visit.  General: Well developed, nourished, in no acute distress, alert and oriented x3   Dermatological: Skin is warm, dry and supple bilateral. Nails x 10 are well maintained; remaining integument appears unremarkable at this time. There are no open sores, no preulcerative lesions, no rash or signs of infection present.  Hallux nail right does demonstrate what appears to be nail that has been worked on the lateral border there is a portion that appears to be intact proximally however the lateral distal portion appears to be loose from  the nailbed.  Currently there is no subungual debris.  Vascular: Dorsalis Pedis artery and Posterior Tibial artery pedal pulses are 2/4 bilateral with immedate capillary fill time. Pedal hair growth present. No varicosities and no lower extremity edema present bilateral.   Neruologic: Grossly intact via light touch bilateral. Vibratory intact via tuning fork bilateral. Protective threshold with Semmes Wienstein monofilament intact to all pedal sites bilateral. Patellar and Achilles deep tendon reflexes 2+ bilateral. No Babinski or clonus noted bilateral.   Musculoskeletal: No gross boney pedal deformities bilateral. No pain, crepitus, or limitation noted with foot and ankle range of motion bilateral. Muscular strength 5/5 in all groups tested bilateral.  Gait: Unassisted, Nonantalgic.    Radiographs:  None taken  Assessment & Plan:   Assessment: Nail dystrophy cannot rule out onychomycosis cannot rule out a proximal ingrown nail lateral border hallux right  Plan: Samples of the skin and nail were taken today for pathologic evaluation.  I also recommended if this is negative they may want to consider removing the proximal lateral nail fold.     Jefferie Holston T. [place], [place]

## 2023-01-08 ENCOUNTER — Encounter: Payer: Self-pay | Admitting: Podiatry

## 2023-01-08 ENCOUNTER — Ambulatory Visit (INDEPENDENT_AMBULATORY_CARE_PROVIDER_SITE_OTHER): Payer: 59 | Admitting: Podiatry

## 2023-01-08 DIAGNOSIS — L600 Ingrowing nail: Secondary | ICD-10-CM | POA: Diagnosis not present

## 2023-01-08 MED ORDER — NEOMYCIN-POLYMYXIN-HC 1 % OT SOLN: OTIC | 1 refills | Status: AC

## 2023-01-08 NOTE — Patient Instructions (Signed)

## 2023-01-09 NOTE — Progress Notes (Signed)
She presents today for follow-up of her nail pathology.  She states that this toenail is still bothering me if she refers to the tibial border.  Objective: Vital signs are stable alert oriented x 3 nail pathology was negative.  She does have sharp incurvated nail margin along the tibial border the hallux left.  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  Plan: Discussed etiology pathology and surgical therapies.  We performed chemical matricectomy today to the tibial border she tolerated procedure well after local anesthetic was administered she is given both oral and home-going structure for care and soaking of the toe as well as a prescription of Cortisporin Otic will follow-up with her in 2 weeks

## 2023-01-29 ENCOUNTER — Ambulatory Visit (INDEPENDENT_AMBULATORY_CARE_PROVIDER_SITE_OTHER): Payer: 59 | Admitting: Podiatry

## 2023-01-29 DIAGNOSIS — L600 Ingrowing nail: Secondary | ICD-10-CM

## 2023-01-29 NOTE — Progress Notes (Signed)
She presents today for follow-up of her matrixectomy hallux right.  She denies fever chills nausea vomit states that she has stopped soaking the toe.  Objective: Vital signs are stable alert oriented x 3 there is no swelling erythema edema cellulitis drainage or odor.  Assessment: Well-healing matrixectomy.  Plan: Discussed etiology pathology conservative versus surgical therapies.  I recommended that she continue to soak at least once daily Epsom salts and warm water and will follow-up with me

## 2023-03-25 ENCOUNTER — Other Ambulatory Visit: Payer: Self-pay | Admitting: Gastroenterology

## 2023-03-25 DIAGNOSIS — R102 Pelvic and perineal pain: Secondary | ICD-10-CM

## 2023-03-25 DIAGNOSIS — R109 Unspecified abdominal pain: Secondary | ICD-10-CM

## 2023-04-03 ENCOUNTER — Ambulatory Visit
Admission: RE | Admit: 2023-04-03 | Discharge: 2023-04-03 | Disposition: A | Discharge status: Home / Self Care | Payer: 59 | Source: Ambulatory Visit | Attending: Gastroenterology | Admitting: Gastroenterology

## 2023-04-03 DIAGNOSIS — R109 Unspecified abdominal pain: Secondary | ICD-10-CM

## 2023-04-03 DIAGNOSIS — R102 Pelvic and perineal pain: Secondary | ICD-10-CM

## 2023-04-03 MED ORDER — IOPAMIDOL (ISOVUE-300) INJECTION 61%
100.0000 mL | Freq: Once | INTRAVENOUS | Status: AC | PRN
  Administered 2023-04-03: 100 mL via INTRAVENOUS

## 2023-04-13 ENCOUNTER — Emergency Department (HOSPITAL_BASED_OUTPATIENT_CLINIC_OR_DEPARTMENT_OTHER)
Admission: EM | Admit: 2023-04-13 | Discharge: 2023-04-13 | Disposition: A | Discharge status: Home / Self Care | Payer: 59 | Attending: Emergency Medicine | Admitting: Emergency Medicine

## 2023-04-13 ENCOUNTER — Other Ambulatory Visit: Payer: Self-pay

## 2023-04-13 ENCOUNTER — Encounter (HOSPITAL_BASED_OUTPATIENT_CLINIC_OR_DEPARTMENT_OTHER): Payer: Self-pay

## 2023-04-13 DIAGNOSIS — R1010 Upper abdominal pain, unspecified: Secondary | ICD-10-CM

## 2023-04-13 DIAGNOSIS — R1011 Right upper quadrant pain: Secondary | ICD-10-CM | POA: Diagnosis present

## 2023-04-13 LAB — URINALYSIS, ROUTINE W REFLEX MICROSCOPIC
Bilirubin Urine: NEGATIVE
Glucose, UA: NEGATIVE mg/dL
Hgb urine dipstick: NEGATIVE
Ketones, ur: NEGATIVE mg/dL
Nitrite: NEGATIVE
Protein, ur: NEGATIVE mg/dL
Specific Gravity, Urine: 1.022 (ref 1.005–1.030)
pH: 6 (ref 5.0–8.0)

## 2023-04-13 LAB — CBC
HCT: 38.1 % (ref 36.0–46.0)
Hemoglobin: 12.4 g/dL (ref 12.0–15.0)
MCH: 30.3 pg (ref 26.0–34.0)
MCHC: 32.5 g/dL (ref 30.0–36.0)
MCV: 93.2 fL (ref 80.0–100.0)
Platelets: 285 10*3/uL (ref 150–400)
RBC: 4.09 MIL/uL (ref 3.87–5.11)
RDW: 12.1 % (ref 11.5–15.5)
WBC: 5.5 10*3/uL (ref 4.0–10.5)
nRBC: 0 % (ref 0.0–0.2)

## 2023-04-13 LAB — COMPREHENSIVE METABOLIC PANEL
ALT: 20 U/L (ref 0–44)
AST: 19 U/L (ref 15–41)
Albumin: 4.3 g/dL (ref 3.5–5.0)
Alkaline Phosphatase: 57 U/L (ref 38–126)
Anion gap: 9 (ref 5–15)
BUN: 16 mg/dL (ref 6–20)
CO2: 29 mmol/L (ref 22–32)
Calcium: 9.3 mg/dL (ref 8.9–10.3)
Chloride: 105 mmol/L (ref 98–111)
Creatinine, Ser: 0.8 mg/dL (ref 0.44–1.00)
GFR, Estimated: >60 mL/min (ref >60)
Glucose, Bld: 84 mg/dL (ref 70–99)
Potassium: 3.9 mmol/L (ref 3.5–5.1)
Sodium: 143 mmol/L (ref 135–145)
Total Bilirubin: 0.4 mg/dL (ref 0.3–1.2)
Total Protein: 7.2 g/dL (ref 6.5–8.1)

## 2023-04-13 LAB — TROPONIN I (HIGH SENSITIVITY): Troponin I (High Sensitivity): 3 ng/L (ref <18)

## 2023-04-13 LAB — LIPASE, BLOOD: Lipase: 18 U/L (ref 11–51)

## 2023-04-13 NOTE — Discharge Instructions (Signed)
Please read and follow all provided instructions.  Your diagnoses today include:  1. Upper abdominal pain     Tests performed today include: Blood cell counts and platelets Kidney and liver function tests Pancreas function test (called lipase) Urine test to look for infection A blood or urine test for pregnancy (women only) Vital signs. See below for your results today.   Medications prescribed:  None  Take any prescribed medications only as directed.  Home care instructions:  Follow any educational materials contained in this packet.  Follow-up instructions: Please follow-up with your primary care provider or gastroenterologist in the next 3 days for further evaluation of your symptoms.    Return instructions:  SEEK IMMEDIATE MEDICAL ATTENTION IF: The pain does not go away or becomes severe  A temperature above 101F develops  Repeated vomiting occurs (multiple episodes)  The pain becomes localized to portions of the abdomen. The right side could possibly be appendicitis. In an adult, the left lower portion of the abdomen could be colitis or diverticulitis.  Blood is being passed in stools or vomit (bright red or black tarry stools)  You develop chest pain, difficulty breathing, dizziness or fainting, or become confused, poorly responsive, or inconsolable (young children) If you have any other emergent concerns regarding your health  Additional Information: Abdominal (belly) pain can be caused by many things. Your caregiver performed an examination and possibly ordered blood/urine tests and imaging (CT scan, x-rays, ultrasound). Many cases can be observed and treated at home after initial evaluation in the emergency department. Even though you are being discharged home, abdominal pain can be unpredictable. Therefore, you need a repeated exam if your pain does not resolve, returns, or worsens. Most patients with abdominal pain don't have to be admitted to the hospital or have  surgery, but serious problems like appendicitis and gallbladder attacks can start out as nonspecific pain. Many abdominal conditions cannot be diagnosed in one visit, so follow-up evaluations are very important.  Your vital signs today were: BP 130/84   Pulse 81   Temp 98.5 F (36.9 C) (Oral)   Resp (!) 22   Ht 5\' 8"  (1.727 m)   Wt 79.4 kg   LMP 12/08/2017 Comment: negative beta HCG 01/01/18  SpO2 99%   BMI 26.61 kg/m  If your blood pressure (bp) was elevated above 135/85 this visit, please have this repeated by your doctor within one month. --------------

## 2023-04-13 NOTE — ED Triage Notes (Signed)
Patient arrives with complaints of intermittent radiating abdominal pain x2 weeks. Patient was seen by an Urgent Care and her GI physician. She had a CT scan as well (did not show anything). She was started on antibiotics but had to discontinue them due to vomiting. Pain is now in her right upper abdomen, 8/10. Patient states that she having a hard time eating as well.

## 2023-04-13 NOTE — ED Provider Notes (Signed)
[place] EMERGENCY DEPARTMENT AT [facility] Provider Note   CSN: [id number] Arrival date & time: 04/13/23  3086     History  No chief complaint on file.   Laurie Meyer is a 57 y.o. female.  Patient with history of cholecystectomy in 2006, appendectomy in 2019, GERD on omeprazole-- presents with c/o right upper quadrant pain.  Patient has had lower abdominal pain ongoing for several weeks.  She has seen urgent care and had normal CMP and lipase.  She followed up with GI who ordered a CT scan about a week ago and a CT scan was normal.  She has had decreased appetite.  The pain moved up to the right upper quadrant about 3 days ago.  No vomiting.  Symptoms initially presented with stool changes, pus noted in his stool.  She states that her GI was considering doing a colonoscopy.  No urinary symptoms or changes.  No chest pain, shortness of breath or trouble breathing.  No cough. Patient denies risk factors for pulmonary embolism including: unilateral leg swelling, history of DVT/PE/other blood clots, use of exogenous hormones, recent immobilizations, recent surgery, recent travel (>4hr segment), malignancy, hemoptysis.          Home Medications Prior to Admission medications   Medication Sig Start Date End Date Taking? Authorizing Provider  citalopram (CELEXA) 10 MG tablet Take 10 mg by mouth daily.    [provider]  DULoxetine (CYMBALTA) 60 MG capsule Take 60 mg by mouth daily.    [provider]  fluticasone furoate-vilanterol (BREO ELLIPTA) 100-25 MCG/INH AEPB Inhale 1 puff into the lungs daily. Patient not taking: Reported on 01/18/2021 12/21/20   Kalman Shan, MD  ibuprofen (ADVIL,MOTRIN) 400 MG tablet Take 1 tablet (400 mg total) by mouth every 8 (eight) hours. 01/01/18   Meuth, Lina Sar, PA-C  levothyroxine (SYNTHROID) 88 MCG tablet Take 88 mcg by mouth daily before breakfast.    [provider]  NEOMYCIN-POLYMYXIN-HYDROCORTISONE  (CORTISPORIN) 1 % SOLN OTIC solution Apply 1-2 drops to toe BID after soaking 01/08/23   Hyatt, Max T, DPM  omeprazole (PRILOSEC) 40 MG capsule Take 40 mg by mouth daily.    [provider]  OSPHENA 60 MG TABS Take 1 tablet by mouth daily.    [provider]      Allergies    Gabapentin and Pregabalin    Review of Systems   Review of Systems  Physical Exam Updated Vital Signs BP 130/84   Pulse 81   Temp 98.5 F (36.9 C) (Oral)   Resp (!) 22   Ht 5\' 8"  (1.727 m)   Wt 79.4 kg   LMP 12/08/2017 Comment: negative beta HCG 01/01/18  SpO2 99%   BMI 26.61 kg/m  Physical Exam Vitals and nursing note reviewed.  Constitutional:      General: She is not in acute distress.    Appearance: She is well-developed.  HENT:     Head: Normocephalic and atraumatic.     Right Ear: External ear normal.     Left Ear: External ear normal.     Nose: Nose normal.  Eyes:     Conjunctiva/sclera: Conjunctivae normal.  Cardiovascular:     Rate and Rhythm: Regular rhythm. Tachycardia present.     Heart sounds: No murmur heard.    Comments: Mild tachycardia, low 100s Pulmonary:     Effort: No respiratory distress.     Breath sounds: No wheezing, rhonchi or rales.  Abdominal:  Palpations: Abdomen is soft.     Tenderness: There is abdominal tenderness. There is no guarding or rebound.     Comments: Mild tenderness to the right upper quadrant and epigastrium without rebound or guarding  Musculoskeletal:     Cervical back: Normal range of motion and neck supple.     Right lower leg: No edema.     Left lower leg: No edema.  Skin:    General: Skin is warm and dry.     Findings: No rash.  Neurological:     General: No focal deficit present.     Mental Status: She is alert. Mental status is at baseline.     Motor: No weakness.  Psychiatric:        Mood and Affect: Mood normal.     ED Results / Procedures / Treatments   Labs (all labs ordered are listed, but only abnormal  results are displayed) Labs Reviewed  URINALYSIS, ROUTINE W REFLEX MICROSCOPIC - Abnormal; Notable for the following components:      Result Value   APPearance HAZY (*)    Leukocytes,Ua SMALL (*)    Bacteria, UA MANY (*)    All other components within normal limits  LIPASE, BLOOD  COMPREHENSIVE METABOLIC PANEL  CBC  TROPONIN I (HIGH SENSITIVITY)    EKG None  Radiology No results found.  Procedures Procedures    Medications Ordered in ED Medications - No data to display  ED Course/ Medical Decision Making/ A&P    Patient seen and examined. History obtained directly from patient.  Reviewed previous CT imaging.  Labs/EKG: Ordered CBC, CMP, lipase, UA, troponin, EKG.  Imaging: None ordered  Medications/Fluids: None ordered.  Offered medication for pain, nausea, IV fluids, patient declines.  Most recent vital signs reviewed and are as follows: BP (!) 163/94 (BP Location: Right Arm)   Pulse (!) 101   Temp 98.5 F (36.9 C) (Oral)   Resp 20   Ht 5\' 8"  (1.727 m)   Wt 79.4 kg   LMP 12/08/2017 Comment: negative beta HCG 01/01/18  SpO2 98%   BMI 26.61 kg/m   Initial impression: Nonspecific abdominal pain  11:57 AM Reassessment performed. Patient appears comfortable, states that she is feeling okay.  Labs personally reviewed and interpreted including: CBC unremarkable; CMP unremarkable; lipase normal; UA without compelling signs of infection.  Reviewed pertinent lab work and imaging with patient at bedside. Questions answered.   Most current vital signs reviewed and are as follows: BP 130/84   Pulse 81   Temp 98.5 F (36.9 C) (Oral)   Resp (!) 22   Ht 5\' 8"  (1.727 m)   Wt 79.4 kg   LMP 12/08/2017 Comment: negative beta HCG 01/01/18  SpO2 99%   BMI 26.61 kg/m   Plan: Discharge to home.   Prescriptions written for: None, offered additional medication for pain but she would prefer to use over-the-counter medications as she has been using.  Other home care  instructions discussed: Parke Simmers diet, avoidance of triggers.  ED return instructions discussed: The patient was urged to return to the Emergency Department immediately with worsening of current symptoms, worsening abdominal pain, persistent vomiting, blood noted in stools, fever, or any other concerns. The patient verbalized understanding.   Follow-up instructions discussed: Patient encouraged to follow-up with their PCP/GI in 3 days.  She states that she does have a colonoscopy scheduled but is not sure if this is necessary.  I encouraged her to talk to her GI about what they  would be looking for and why this may be beneficial for her.                                  Medical Decision Making Amount and/or Complexity of Data Reviewed Labs: ordered.   For this patient's complaint of abdominal pain, the following conditions were considered on the differential diagnosis: gastritis/PUD, enteritis/duodenitis, appendicitis, cholelithiasis/cholecystitis, cholangitis, pancreatitis, ruptured viscus, colitis, diverticulitis, small/large bowel obstruction, proctitis, cystitis, pyelonephritis, ureteral colic, aortic dissection, aortic aneurysm. In women, ectopic pregnancy, pelvic inflammatory disease, ovarian cysts, and tubo-ovarian abscess were also considered. Atypical chest etiologies were also considered including ACS, PE, and pneumonia.  Normal white blood cell count and no signs of biliary obstruction today.  Patient had a CT about a week ago which was normal.  I do not feel that this would likely be beneficial to repeat today.  Patient in agreement.  The patient's vital signs, pertinent lab work and imaging were reviewed and interpreted as discussed in the ED course. Hospitalization was considered for further testing, treatments, or serial exams/observation. However as patient is well-appearing, has a stable exam, and reassuring studies today, I do not feel that they warrant admission at this time. This  plan was discussed with the patient who verbalizes agreement and comfort with this plan and seems reliable and able to return to the Emergency Department with worsening or changing symptoms.          Final Clinical Impression(s) / ED Diagnoses Final diagnoses:  Upper abdominal pain    Rx / DC Orders ED Discharge Orders     None         Renne Crigler, PA-C 04/13/23 1159    Benjiman Core, MD 04/13/23 1235

## 2023-05-30 ENCOUNTER — Other Ambulatory Visit: Payer: Self-pay | Admitting: Medical Genetics

## 2023-05-30 DIAGNOSIS — Z006 Encounter for examination for normal comparison and control in clinical research program: Secondary | ICD-10-CM

## 2023-06-25 ENCOUNTER — Other Ambulatory Visit (HOSPITAL_COMMUNITY): Payer: Self-pay

## 2023-07-11 ENCOUNTER — Other Ambulatory Visit (HOSPITAL_COMMUNITY)
Admission: RE | Admit: 2023-07-11 | Discharge: 2023-07-11 | Disposition: A | Discharge status: Home / Self Care | Payer: Self-pay | Source: Ambulatory Visit | Attending: Medical Genetics | Admitting: Medical Genetics

## 2023-07-11 DIAGNOSIS — Z006 Encounter for examination for normal comparison and control in clinical research program: Secondary | ICD-10-CM | POA: Insufficient documentation

## 2023-07-22 LAB — GENECONNECT MOLECULAR SCREEN: Genetic Analysis Overall Interpretation: NEGATIVE
